# Patient Record
Sex: Male | Born: 1980 | Race: White | Hispanic: No | Marital: Married | Smoking: Never smoker
Health system: Southern US, Community
[De-identification: ages and names within clinical notes are randomized; demographics above are authoritative.]

## PROBLEM LIST (undated history)

## (undated) DIAGNOSIS — K760 Fatty (change of) liver, not elsewhere classified: Secondary | ICD-10-CM

## (undated) DIAGNOSIS — I10 Essential (primary) hypertension: Secondary | ICD-10-CM

## (undated) DIAGNOSIS — R51 Headache: Secondary | ICD-10-CM

## (undated) DIAGNOSIS — E7800 Pure hypercholesterolemia, unspecified: Secondary | ICD-10-CM

## (undated) DIAGNOSIS — IMO0002 Reserved for concepts with insufficient information to code with codable children: Secondary | ICD-10-CM

## (undated) DIAGNOSIS — G4730 Sleep apnea, unspecified: Secondary | ICD-10-CM

## (undated) DIAGNOSIS — R519 Headache, unspecified: Secondary | ICD-10-CM

## (undated) HISTORY — DX: Fatty (change of) liver, not elsewhere classified: K76.0

## (undated) HISTORY — DX: Headache, unspecified: R51.9

## (undated) HISTORY — DX: Headache: R51

## (undated) HISTORY — DX: Sleep apnea, unspecified: G47.30

---

## 2002-06-05 ENCOUNTER — Encounter (HOSPITAL_COMMUNITY): Admission: RE | Admit: 2002-06-05 | Discharge: 2002-07-05 | Payer: Self-pay | Admitting: Preventative Medicine

## 2002-06-14 HISTORY — PX: OTHER SURGICAL HISTORY: SHX169

## 2003-02-19 ENCOUNTER — Encounter: Payer: Self-pay | Admitting: Family Medicine

## 2003-02-19 ENCOUNTER — Ambulatory Visit (HOSPITAL_COMMUNITY): Admission: RE | Admit: 2003-02-19 | Discharge: 2003-02-19 | Payer: Self-pay | Admitting: Family Medicine

## 2003-02-27 ENCOUNTER — Emergency Department (HOSPITAL_COMMUNITY): Admission: EM | Admit: 2003-02-27 | Discharge: 2003-02-27 | Payer: Self-pay | Admitting: Emergency Medicine

## 2003-02-27 ENCOUNTER — Encounter: Payer: Self-pay | Admitting: Emergency Medicine

## 2003-03-21 ENCOUNTER — Encounter: Payer: Self-pay | Admitting: Otolaryngology

## 2003-03-21 ENCOUNTER — Ambulatory Visit (HOSPITAL_COMMUNITY): Admission: RE | Admit: 2003-03-21 | Discharge: 2003-03-21 | Payer: Self-pay | Admitting: Otolaryngology

## 2003-05-20 ENCOUNTER — Ambulatory Visit (HOSPITAL_COMMUNITY): Admission: RE | Admit: 2003-05-20 | Discharge: 2003-05-20 | Payer: Self-pay | Admitting: Neurology

## 2003-12-08 ENCOUNTER — Emergency Department (HOSPITAL_COMMUNITY): Admission: EM | Admit: 2003-12-08 | Discharge: 2003-12-08 | Payer: Self-pay | Admitting: Emergency Medicine

## 2007-06-27 ENCOUNTER — Ambulatory Visit (HOSPITAL_COMMUNITY): Admission: RE | Admit: 2007-06-27 | Discharge: 2007-06-27 | Payer: Self-pay | Admitting: Family Medicine

## 2007-07-17 ENCOUNTER — Emergency Department (HOSPITAL_COMMUNITY): Admission: EM | Admit: 2007-07-17 | Discharge: 2007-07-17 | Payer: Self-pay | Admitting: Emergency Medicine

## 2007-11-16 ENCOUNTER — Emergency Department (HOSPITAL_COMMUNITY): Admission: EM | Admit: 2007-11-16 | Discharge: 2007-11-16 | Payer: Self-pay | Admitting: Emergency Medicine

## 2007-11-20 ENCOUNTER — Emergency Department (HOSPITAL_COMMUNITY): Admission: EM | Admit: 2007-11-20 | Discharge: 2007-11-21 | Payer: Self-pay | Admitting: Emergency Medicine

## 2008-01-09 ENCOUNTER — Emergency Department (HOSPITAL_COMMUNITY): Admission: EM | Admit: 2008-01-09 | Discharge: 2008-01-09 | Payer: Self-pay | Admitting: Emergency Medicine

## 2009-07-05 ENCOUNTER — Emergency Department (HOSPITAL_COMMUNITY): Admission: EM | Admit: 2009-07-05 | Discharge: 2009-07-05 | Payer: Self-pay | Admitting: Emergency Medicine

## 2009-07-07 ENCOUNTER — Ambulatory Visit (HOSPITAL_COMMUNITY): Admission: RE | Admit: 2009-07-07 | Discharge: 2009-07-07 | Payer: Self-pay | Admitting: Emergency Medicine

## 2009-08-20 ENCOUNTER — Emergency Department (HOSPITAL_COMMUNITY): Admission: EM | Admit: 2009-08-20 | Discharge: 2009-08-20 | Payer: Self-pay | Admitting: Emergency Medicine

## 2009-11-21 ENCOUNTER — Ambulatory Visit: Payer: Self-pay | Admitting: Family Medicine

## 2009-11-21 DIAGNOSIS — R5383 Other fatigue: Secondary | ICD-10-CM

## 2009-11-21 DIAGNOSIS — R5381 Other malaise: Secondary | ICD-10-CM

## 2010-01-20 ENCOUNTER — Ambulatory Visit: Payer: Self-pay | Admitting: Family Medicine

## 2010-01-20 ENCOUNTER — Encounter: Payer: Self-pay | Admitting: Physician Assistant

## 2010-01-20 DIAGNOSIS — G4700 Insomnia, unspecified: Secondary | ICD-10-CM

## 2010-01-21 ENCOUNTER — Encounter: Payer: Self-pay | Admitting: Physician Assistant

## 2010-01-21 LAB — CONVERTED CEMR LAB
HCV Ab: NEGATIVE
Hepatitis B Surface Ag: NEGATIVE

## 2010-01-23 LAB — CONVERTED CEMR LAB
Albumin: 4.4 g/dL (ref 3.5–5.2)
BUN: 13 mg/dL (ref 6–23)
CO2: 28 meq/L (ref 19–32)
Calcium: 9.6 mg/dL (ref 8.4–10.5)
Chloride: 104 meq/L (ref 96–112)
Creatinine, Ser: 0.86 mg/dL (ref 0.40–1.50)
Glucose, Bld: 91 mg/dL (ref 70–99)
HCT: 47.7 % (ref 39.0–52.0)
HDL: 30 mg/dL — ABNORMAL LOW (ref >39)
Hemoglobin: 15.9 g/dL (ref 13.0–17.0)
LDL Cholesterol: 131 mg/dL — ABNORMAL HIGH (ref 0–99)
Potassium: 4.4 meq/L (ref 3.5–5.3)
RBC: 5.34 M/uL (ref 4.22–5.81)
TSH: 1.433 microintl units/mL (ref 0.350–4.500)
Total CHOL/HDL Ratio: 7.1
Triglycerides: 259 mg/dL — ABNORMAL HIGH (ref <150)
WBC: 6.7 10*3/uL (ref 4.0–10.5)

## 2010-04-09 ENCOUNTER — Emergency Department (HOSPITAL_COMMUNITY): Admission: EM | Admit: 2010-04-09 | Discharge: 2010-04-09 | Payer: Self-pay | Admitting: Emergency Medicine

## 2010-04-21 ENCOUNTER — Emergency Department (HOSPITAL_COMMUNITY): Admission: EM | Admit: 2010-04-21 | Discharge: 2010-04-21 | Payer: Self-pay | Admitting: Emergency Medicine

## 2010-07-14 NOTE — Assessment & Plan Note (Signed)
Summary: NEW PATIENT room 1   Vital Signs:  Patient profile:   30 year old male Height:      75.5 inches Weight:      337 pounds BMI:     41.72 O2 Sat:      98 % Pulse rate:   91 / minute Resp:     16 per minute BP sitting:   120 / 82  (left arm) Cuff size:   large  Vitals Entered By: Everitt Amber LPN (November 21, 2009 8:10 AM)  Nutrition Counseling: Patient's BMI is greater than 25 and therefore counseled on weight management options. CC: New Patient    CC:  New Patient .  History of Present Illness: New pt here to establish care with new PCP.  Pt states he has some painful sores in the back of his mouth on the R side x few days.  It is painful for him to swallow or eat.  He had same on L side about 1 - 1 1/2 mos ago.  No fever or chills.  He has had a cough x 2-3 days. Cough is prod with clear phlegm.  No nasal congestion or PND.  FH of DM.  Pt has not been tested for this.    Current Medications (verified): 1)  None  Allergies (verified): No Known Drug Allergies  Past History:  Past medical, surgical, family and social histories (including risk factors) reviewed, and no changes noted (except as noted below).  Past Surgical History: Cyst removed from throat-2004  Family History: Reviewed history and no changes required. Mother-deceased-diabetic, complete renal failure, on dialysis Father-living-diabetic, liver failure 1 sister-healthy 2 brothers-healthy  Social History: Reviewed history and no changes required. Occupation: Public relations account executive Single Never Smoked Alcohol use-no Drug use-no Regular exercise-yes Occupation:  employed Smoking Status:  never Drug Use:  no Does Patient Exercise:  yes  Review of Systems General:  Denies chills and fever. ENT:  Complains of earache and sore throat; denies nasal congestion and postnasal drainage; INTERMITTENT PAIN R EAR FROM THROAT. CV:  Denies chest pain or discomfort and palpitations. Resp:  Complains of  cough and sputum productive; denies shortness of breath.  Physical Exam  General:  well-nourished, well-hydrated, appropriate dress, cooperative to examination, good hygiene, and overweight-appearing.   Head:  Normocephalic and atraumatic without obvious abnormalities. No apparent alopecia or balding. Eyes:  pupils equal, pupils round, pupils reactive to light, and no injection.   Ears:  External ear exam shows no significant lesions or deformities.  Otoscopic examination reveals clear canals, tympanic membranes are intact bilaterally without bulging, retraction, inflammation or discharge. Hearing is grossly normal bilaterally. Nose:  External nasal examination shows no deformity or inflammation. Nasal mucosa are pink and moist without lesions or exudates. Mouth:  good dentition, no gingival abnormalities, and aphthous ulcer(s) noted Rt anterior pillar area.  Remainder of oroph  & mouth neg. Neck:  No deformities, masses, or tenderness noted.no thyromegaly and no thyroid nodules or tenderness.   Lungs:  Normal respiratory effort, chest expands symmetrically. Lungs are clear to auscultation, no crackles or wheezes. Heart:  Normal rate and regular rhythm. S1 and S2 normal without gallop, murmur, click, rub or other extra sounds. Skin:  Intact without suspicious lesions or rashes Cervical Nodes:  No lymphadenopathy noted Psych:  Cognition and judgment appear intact. Alert and cooperative with normal attention span and concentration. No apparent delusions, illusions, hallucinations   Impression & Recommendations:  Problem # 1:  APHTHOUS ULCERS (ICD-528.2) Assessment New  Discussed with pt that this is a viral infection.  Problem # 2:  ACUTE BRONCHITIS (ICD-466.0) Assessment: New Discussed viral infection.  Pt to call if increased s/s.  To use an over the counter cough med as needed.  Problem # 3:  OBESITY, MORBID (ICD-278.01) Assessment: Comment Only Encouraged exercise & discussed dietary  changes and healthy food choices.  Orders: T-CMP with estimated GFR ([address]) T-Lipid Profile [phone]) T- Hemoglobin A1C (82956-21308)  Complete Medication List: 1)  Acyclovir 400 Mg Tabs (Acyclovir) .... Take 1 two times a day x 7 days 2)  Lidocaine Viscous 2 % Soln (Lidocaine hcl) .... Apply to sores in mouth every 3-4 hrs as needed for pain  Other Orders: T-CBC No Diff ([id number]) T-TSH (52841-32440) T-Vitamin D (25-Hydroxy) (10272-53664)  Patient Instructions: 1)  Please schedule a follow-up appointment in 4 months. 2)  It is important that you exercise regularly at least 20 minutes 5 times a week. If you develop chest pain, have severe difficulty breathing, or feel very tired , stop exercising immediately and seek medical attention. 3)  You need to lose weight. Consider a lower calorie diet and regular exercise.  4)  I have ordered blood work for you. Prescriptions: LIDOCAINE VISCOUS 2 % SOLN (LIDOCAINE HCL) apply to sores in mouth every 3-4 hrs as needed for pain  #1 x 0   Entered and Authorized by:   Esperanza Sheets PA   Signed by:   Esperanza Sheets PA on 11/21/2009   Method used:   Electronically to        [facility]  [address] (retail)       [address]       [place], [place]  [address]       Ph: [phone]       Fax: [phone]   RxID:   [id number] ACYCLOVIR 400 MG TABS (ACYCLOVIR) take 1 two times a day x 7 days  #14 x 0   Entered and Authorized by:   Esperanza Sheets PA   Signed by:   Esperanza Sheets PA on 11/21/2009   Method used:   Electronically to        [facility]  [address] (retail)       [address]       [place], [place]  [address]       Ph: [phone]       Fax: [phone]   RxID:   [id number]

## 2010-07-14 NOTE — Assessment & Plan Note (Signed)
Summary: trouble sleeping- room 1   Vital Signs:  Patient profile:   30 year old male Height:      75.5 inches Weight:      353.25 pounds BMI:     43.73 O2 Sat:      98 % on Room air Pulse rate:   87 / minute Resp:     16 per minute BP sitting:   140 / 90  (left arm)  Vitals Entered By: Adella Hare LPN (January 20, 2010 9:17 AM)  Serial Vital Signs/Assessments:  Time      Position  BP       Pulse  Resp  Temp     By                     120/84                         Esperanza Sheets PA  CC: trouble sleeping Is Patient Diabetic? No Pain Assessment Patient in pain? no        CC:  trouble sleeping.  History of Present Illness: Pt presents today with c/o diffculty sleeping x approx 1 1/2 mos.  He began a new job about 2 mos ago and is now working nights.  He thought his sleeping pattern would switch over but he is having difficulty still.  He states he awakens frequently when trying to sleep and has a hard time getting back to sleep.  Denies feelings of depression or anxiety.  No sleep issues until started working nights. + snoring, but no observed apeas or awakening with difficulty breathing or gasping. He has tried discontinuing caffeine from his diet but no help. He tries to maintain a regular schedule even on his days off.   Allergies (verified): No Known Drug Allergies  Past History:  Past medical history reviewed for relevance to current acute and chronic problems.  Review of Systems CV:  Denies chest pain or discomfort. Resp:  Denies cough and shortness of breath. Psych:  Denies anxiety, depression, easily angered, irritability, suicidal thoughts/plans, and thoughts /plans of harming others.  Physical Exam  General:  Well-developed,well-nourished,in no acute distress; alert,appropriate and cooperative throughout examination Head:  Normocephalic and atraumatic without obvious abnormalities. No apparent alopecia or balding. Ears:  External ear exam shows no  significant lesions or deformities.  Otoscopic examination reveals clear canals, tympanic membranes are intact bilaterally without bulging, retraction, inflammation or discharge. Hearing is grossly normal bilaterally. Nose:  External nasal examination shows no deformity or inflammation. Nasal mucosa are pink and moist without lesions or exudates. Mouth:  Oral mucosa and oropharynx without lesions or exudates.  Teeth in good repair. Neck:  No deformities, masses, or tenderness noted. Lungs:  Normal respiratory effort, chest expands symmetrically. Lungs are clear to auscultation, no crackles or wheezes. Heart:  Normal rate and regular rhythm. S1 and S2 normal without gallop, murmur, click, rub or other extra sounds. Cervical Nodes:  No lymphadenopathy noted Psych:  Cognition and judgment appear intact. Alert and cooperative with normal attention span and concentration. No apparent delusions, illusions, hallucinations   Impression & Recommendations:  Problem # 1:  INSOMNIA (ICD-780.52) Assessment New Onset with new job working nights.  His updated medication list for this problem includes:    Zolpidem Tartrate 10 Mg Tabs (Zolpidem tartrate) .Marland Kitchen... Take 1 tablet at bedtime as needed for sleep  Problem # 2:  OBESITY, MORBID (ICD-278.01) Assessment: Deteriorated Discussed healthy diet,  exercise and wt loss. Discussed risk of DM and sleep apnea due to wt. Encouraged pt to have labs done previously ordered.  Another copy of the orders given to pt today.  Complete Medication List: 1)  Lidocaine Viscous 2 % Soln (Lidocaine hcl) .... Apply to sores in mouth every 3-4 hrs as needed for pain 2)  Zolpidem Tartrate 10 Mg Tabs (Zolpidem tartrate) .... Take 1 tablet at bedtime as needed for sleep  Patient Instructions: 1)  Keep your next appt. 2)  I have prescribed a sleeping medication to help you establish your new sleep schedule. 3)  It is important that you exercise regularly at least 20 minutes 5  times a week. If you develop chest pain, have severe difficulty breathing, or feel very tired , stop exercising immediately and seek medical attention. 4)  You need to lose weight. Consider a lower calorie diet and regular exercise.  Prescriptions: ZOLPIDEM TARTRATE 10 MG TABS (ZOLPIDEM TARTRATE) take 1 tablet at bedtime as needed for sleep  #30 x 0   Entered and Authorized by:   Esperanza Sheets PA   Signed by:   Esperanza Sheets PA on 01/20/2010   Method used:   Print then Give to Patient   RxID:   [id number]

## 2010-07-19 ENCOUNTER — Emergency Department (HOSPITAL_COMMUNITY)
Admission: EM | Admit: 2010-07-19 | Discharge: 2010-07-19 | Disposition: A | Discharge status: Home / Self Care | Payer: BC Managed Care – PPO | Attending: Emergency Medicine | Admitting: Emergency Medicine

## 2010-07-19 ENCOUNTER — Emergency Department (HOSPITAL_COMMUNITY): Payer: BC Managed Care – PPO

## 2010-07-19 DIAGNOSIS — R630 Anorexia: Secondary | ICD-10-CM | POA: Insufficient documentation

## 2010-07-19 DIAGNOSIS — R6883 Chills (without fever): Secondary | ICD-10-CM | POA: Insufficient documentation

## 2010-07-19 DIAGNOSIS — R51 Headache: Secondary | ICD-10-CM | POA: Insufficient documentation

## 2010-07-19 DIAGNOSIS — R197 Diarrhea, unspecified: Secondary | ICD-10-CM | POA: Insufficient documentation

## 2010-07-19 DIAGNOSIS — K219 Gastro-esophageal reflux disease without esophagitis: Secondary | ICD-10-CM | POA: Insufficient documentation

## 2010-07-19 DIAGNOSIS — B9789 Other viral agents as the cause of diseases classified elsewhere: Secondary | ICD-10-CM | POA: Insufficient documentation

## 2010-07-19 DIAGNOSIS — R05 Cough: Secondary | ICD-10-CM | POA: Insufficient documentation

## 2010-07-19 DIAGNOSIS — R5381 Other malaise: Secondary | ICD-10-CM | POA: Insufficient documentation

## 2010-07-19 DIAGNOSIS — R112 Nausea with vomiting, unspecified: Secondary | ICD-10-CM | POA: Insufficient documentation

## 2010-07-19 DIAGNOSIS — R059 Cough, unspecified: Secondary | ICD-10-CM | POA: Insufficient documentation

## 2010-07-19 DIAGNOSIS — R5383 Other fatigue: Secondary | ICD-10-CM | POA: Insufficient documentation

## 2010-07-19 DIAGNOSIS — IMO0001 Reserved for inherently not codable concepts without codable children: Secondary | ICD-10-CM | POA: Insufficient documentation

## 2010-07-19 DIAGNOSIS — R070 Pain in throat: Secondary | ICD-10-CM | POA: Insufficient documentation

## 2010-08-01 ENCOUNTER — Emergency Department (HOSPITAL_COMMUNITY)
Admission: EM | Admit: 2010-08-01 | Discharge: 2010-08-01 | Disposition: A | Discharge status: Home / Self Care | Payer: BC Managed Care – PPO | Attending: Emergency Medicine | Admitting: Emergency Medicine

## 2010-08-01 ENCOUNTER — Emergency Department (HOSPITAL_COMMUNITY): Payer: BC Managed Care – PPO

## 2010-08-01 DIAGNOSIS — J069 Acute upper respiratory infection, unspecified: Secondary | ICD-10-CM | POA: Insufficient documentation

## 2010-08-01 DIAGNOSIS — J029 Acute pharyngitis, unspecified: Secondary | ICD-10-CM | POA: Insufficient documentation

## 2010-08-01 DIAGNOSIS — R059 Cough, unspecified: Secondary | ICD-10-CM | POA: Insufficient documentation

## 2010-08-01 DIAGNOSIS — R05 Cough: Secondary | ICD-10-CM | POA: Insufficient documentation

## 2010-08-26 LAB — RAPID STREP SCREEN (MED CTR MEBANE ONLY): Streptococcus, Group A Screen (Direct): NEGATIVE

## 2010-08-26 LAB — CULTURE, RESPIRATORY W GRAM STAIN: Gram Stain: NONE SEEN

## 2010-08-30 LAB — URINALYSIS, ROUTINE W REFLEX MICROSCOPIC
Bilirubin Urine: NEGATIVE
Glucose, UA: NEGATIVE mg/dL
Hgb urine dipstick: NEGATIVE
Protein, ur: NEGATIVE mg/dL
Specific Gravity, Urine: <1.005 — ABNORMAL LOW (ref 1.005–1.030)
Urobilinogen, UA: 0.2 mg/dL (ref 0.0–1.0)

## 2010-08-30 LAB — CBC
HCT: 46.4 % (ref 39.0–52.0)
MCHC: 34.2 g/dL (ref 30.0–36.0)
MCV: 88.7 fL (ref 78.0–100.0)
RBC: 5.23 MIL/uL (ref 4.22–5.81)
WBC: 9.4 10*3/uL (ref 4.0–10.5)

## 2010-08-30 LAB — COMPREHENSIVE METABOLIC PANEL
BUN: 8 mg/dL (ref 6–23)
CO2: 27 mEq/L (ref 19–32)
Calcium: 9.1 mg/dL (ref 8.4–10.5)
Chloride: 105 mEq/L (ref 96–112)
Creatinine, Ser: 0.75 mg/dL (ref 0.4–1.5)
GFR calc non Af Amer: >60 mL/min (ref >60)
Total Bilirubin: 1 mg/dL (ref 0.3–1.2)

## 2010-08-30 LAB — LIPASE, BLOOD: Lipase: 28 U/L (ref 11–59)

## 2010-08-30 LAB — DIFFERENTIAL
Basophils Absolute: 0.1 10*3/uL (ref 0.0–0.1)
Eosinophils Relative: 1 % (ref 0–5)
Lymphocytes Relative: 33 % (ref 12–46)
Lymphs Abs: 3.1 10*3/uL (ref 0.7–4.0)
Neutro Abs: 5.4 10*3/uL (ref 1.7–7.7)
Neutrophils Relative %: 57 % (ref 43–77)

## 2011-02-08 ENCOUNTER — Ambulatory Visit (INDEPENDENT_AMBULATORY_CARE_PROVIDER_SITE_OTHER): Payer: BC Managed Care – PPO | Admitting: Family Medicine

## 2011-02-08 ENCOUNTER — Encounter: Payer: Self-pay | Admitting: Family Medicine

## 2011-02-08 DIAGNOSIS — J019 Acute sinusitis, unspecified: Secondary | ICD-10-CM | POA: Insufficient documentation

## 2011-02-08 DIAGNOSIS — R21 Rash and other nonspecific skin eruption: Secondary | ICD-10-CM

## 2011-02-08 MED ORDER — AMOXICILLIN-POT CLAVULANATE 875-125 MG PO TABS: 1.0000 | ORAL_TABLET | Freq: Two times a day (BID) | ORAL | Status: AC

## 2011-02-08 MED ORDER — METHYLPREDNISOLONE ACETATE 80 MG/ML IJ SUSP
80.0000 mg | Freq: Once | INTRAMUSCULAR | Status: AC
  Administered 2011-02-08: 80 mg via INTRAMUSCULAR

## 2011-02-08 MED ORDER — FLUTICASONE PROPIONATE 50 MCG/ACT NA SUSP: 1.0000 | Freq: Two times a day (BID) | NASAL | Status: DC

## 2011-02-08 NOTE — Progress Notes (Signed)
  Subjective:    Patient ID: Alejandro Kennedy, male    DOB: 07-01-1980, 30 y.o.   MRN: [id number]  HPI Sinus problems- cough mild production, green discharge for 3 weeks, has tried many allergy medications (Sudafed, bendryl, tylenol products)  Subjective fever, chills , non smoker, difficulty sleeping secondary to congestion and he also wears a BiPAP. A previous history of recurrent sinus problems.   OSA- sleeps with BIpap , had sleep study at [facility] --- has been on BiPAP for 4 months, unable to sleep comfortably with BiPAP secondary to nasal congestion  Rash- on LLE- started 3 weeks ago, initially had a spot on his arm now spread down, itches, used hydrocortisone, no new soap or lotion. No sick contacts, unsure if he was bitten by an insect.  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, +fever, weight loss,weakness,+ recent illness HEENT- denies eye drainage, change in vision, nasal discharge,sore throat CVS- denies chest pain, palpitations RESP- denies SOB, occ cough, denies wheeze ABD- denies N/V, change in stools, abd pain MSK- denies joint pain, muscle aches, injury Neuro- +headache, denies dizziness, syncope, seizure activity        Objective:   Physical Exam GEN- NAD, alert and oriented, obese HEENT- PERRL, EOMI, non icteric, pink conjunctiva, MMM, TM clear bilat, enlarged turbinates, thick mucous nares, mild injection of post oropharynx TTP over maxillary and ethmoid sinus on right side Nodes- shotty submandibular and cervical LAD CVS-RRR,no murmur RESP-CTAB EXT- no edema Skin- Left forearm, erythema extending to 2cm above wrist, mild warmth, multiple exocoriated lesions, ezematous rash near flexoral areas, no draining lesion, non tender to palpation Pulse- radial 2+      Assessment & Plan:

## 2011-02-08 NOTE — Patient Instructions (Addendum)
For your sinus infection, you have been given a steroid Use the flonase as well 1 spray twice a day For your skin- you can continue the cortisone, the antibiotic should help as well Return in 2 weeks for a recheck on skin, if clear you can cancel the appt

## 2011-02-09 ENCOUNTER — Telehealth: Payer: Self-pay | Admitting: Family Medicine

## 2011-02-09 ENCOUNTER — Encounter: Payer: Self-pay | Admitting: Family Medicine

## 2011-02-09 NOTE — Telephone Encounter (Signed)
You may fax a letter out of work for tonight 02/09/11. He should continue medications

## 2011-02-09 NOTE — Assessment & Plan Note (Signed)
Unsure of patient's cause of rash. He does have warmth to the area which makes me a little concern for cellulitis. However his symptoms are mostly itching which is more consistent with a contact dermatitis. He was given steroid injection today he will continue to use the hydrocortisone cream which helps some. Given Augmentin to cover sinusitis as well. He will followup in 2 weeks if there is no improvement

## 2011-02-09 NOTE — Assessment & Plan Note (Signed)
We'll treat for acute sinusitis based only of symptoms. We'll start Augmentin twice a day as well as Flonase. Patient given steroid injection in clinic

## 2011-02-10 NOTE — Telephone Encounter (Signed)
Called patient, no answer 

## 2011-02-10 NOTE — Telephone Encounter (Signed)
Letter printed. Called patient no answer

## 2011-02-11 NOTE — Telephone Encounter (Signed)
Letter ready for pick up.  Patient aware  

## 2011-03-01 ENCOUNTER — Encounter: Payer: Self-pay | Admitting: Family Medicine

## 2011-03-01 ENCOUNTER — Ambulatory Visit (INDEPENDENT_AMBULATORY_CARE_PROVIDER_SITE_OTHER): Payer: BC Managed Care – PPO | Admitting: Family Medicine

## 2011-03-01 DIAGNOSIS — R7989 Other specified abnormal findings of blood chemistry: Secondary | ICD-10-CM

## 2011-03-01 DIAGNOSIS — E785 Hyperlipidemia, unspecified: Secondary | ICD-10-CM | POA: Insufficient documentation

## 2011-03-01 DIAGNOSIS — R21 Rash and other nonspecific skin eruption: Secondary | ICD-10-CM

## 2011-03-01 MED ORDER — BETAMETHASONE DIPROPIONATE 0.05 % EX CREA: TOPICAL_CREAM | Freq: Two times a day (BID) | CUTANEOUS | Status: DC

## 2011-03-01 MED ORDER — OMEPRAZOLE 40 MG PO CPDR: 40.0000 mg | DELAYED_RELEASE_CAPSULE | Freq: Every day | ORAL | Status: DC

## 2011-03-01 NOTE — Assessment & Plan Note (Signed)
Check FLP 

## 2011-03-01 NOTE — Assessment & Plan Note (Signed)
This still looks like an atopic dermatitis of some sort. Will give high potency steroid cream as it is improved, hydrate skin, if no improvement in next 7-10 days will refer to derm at pt request, he will call and let me know

## 2011-03-01 NOTE — Progress Notes (Signed)
  Subjective:    Patient ID: Alejandro Kennedy, male    DOB: July 17, 1980, 30 y.o.   MRN: [id number]  HPI Patient here to followup on left arm rash. He is status post Augmentin which is also used for his sinusitis, steroid injection and topical hydrocortisone approximately 2 weeks ago. The rash has improved on his left forearm especially after steroids  however he continues to have intense itching at times. Denies fever or pustules  Labs- labs reviewed from one year ago. 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

## 2011-03-01 NOTE — Patient Instructions (Addendum)
Get your labs done- fasting   I will check your cholesterol and liver enzymes Use the steroid cream and keep lotion on the arm- if your arm does not improve then I will send you to dermatology We will call with lab results  You should be seen at least once a year

## 2011-03-01 NOTE — Assessment & Plan Note (Signed)
Unclear cause, pt states he was sick at that time. Will recheck LFT, discussed with pt and wife, the labs were from over 1 year ago, I have no specific clues besides elevated lipids why his LFT would be up. If still elevated would check viral panel as he works in Editor, commissioning, ultrasound. He has not been taking any hepatoxic OTC meds such as Tylenol on a regular basis

## 2011-03-11 LAB — RAPID STREP SCREEN (MED CTR MEBANE ONLY): Streptococcus, Group A Screen (Direct): NEGATIVE

## 2011-03-11 LAB — MONONUCLEOSIS SCREEN: Mono Screen: NEGATIVE

## 2011-05-03 ENCOUNTER — Emergency Department (HOSPITAL_COMMUNITY)
Admission: EM | Admit: 2011-05-03 | Discharge: 2011-05-03 | Disposition: A | Discharge status: Home / Self Care | Payer: BC Managed Care – PPO | Attending: Emergency Medicine | Admitting: Emergency Medicine

## 2011-05-03 ENCOUNTER — Encounter (HOSPITAL_COMMUNITY): Payer: Self-pay | Admitting: *Deleted

## 2011-05-03 ENCOUNTER — Emergency Department (HOSPITAL_COMMUNITY): Payer: BC Managed Care – PPO

## 2011-05-03 ENCOUNTER — Telehealth: Payer: Self-pay

## 2011-05-03 DIAGNOSIS — R748 Abnormal levels of other serum enzymes: Secondary | ICD-10-CM

## 2011-05-03 DIAGNOSIS — R109 Unspecified abdominal pain: Secondary | ICD-10-CM

## 2011-05-03 DIAGNOSIS — R10819 Abdominal tenderness, unspecified site: Secondary | ICD-10-CM | POA: Insufficient documentation

## 2011-05-03 DIAGNOSIS — R609 Edema, unspecified: Secondary | ICD-10-CM | POA: Insufficient documentation

## 2011-05-03 DIAGNOSIS — R7402 Elevation of levels of lactic acid dehydrogenase (LDH): Secondary | ICD-10-CM | POA: Insufficient documentation

## 2011-05-03 DIAGNOSIS — R7401 Elevation of levels of liver transaminase levels: Secondary | ICD-10-CM | POA: Insufficient documentation

## 2011-05-03 HISTORY — DX: Reserved for concepts with insufficient information to code with codable children: IMO0002

## 2011-05-03 LAB — COMPREHENSIVE METABOLIC PANEL
ALT: 65 U/L — ABNORMAL HIGH (ref 0–53)
AST: 36 U/L (ref 0–37)
CO2: 28 mEq/L (ref 19–32)
Calcium: 10.2 mg/dL (ref 8.4–10.5)
GFR calc non Af Amer: >90 mL/min (ref >90)
Potassium: 4 mEq/L (ref 3.5–5.1)
Sodium: 137 mEq/L (ref 135–145)
Total Protein: 7.4 g/dL (ref 6.0–8.3)

## 2011-05-03 LAB — URINALYSIS, ROUTINE W REFLEX MICROSCOPIC
Glucose, UA: NEGATIVE mg/dL
Hgb urine dipstick: NEGATIVE
Specific Gravity, Urine: >1.03 — ABNORMAL HIGH (ref 1.005–1.030)

## 2011-05-03 LAB — DIFFERENTIAL
Basophils Absolute: 0 10*3/uL (ref 0.0–0.1)
Eosinophils Relative: 2 % (ref 0–5)
Lymphocytes Relative: 33 % (ref 12–46)
Neutrophils Relative %: 56 % (ref 43–77)

## 2011-05-03 LAB — CBC
MCV: 87.7 fL (ref 78.0–100.0)
Platelets: 230 10*3/uL (ref 150–400)
RDW: 13 % (ref 11.5–15.5)
WBC: 7.6 10*3/uL (ref 4.0–10.5)

## 2011-05-03 MED ORDER — IOHEXOL 300 MG/ML  SOLN
100.0000 mL | Freq: Once | INTRAMUSCULAR | Status: AC | PRN
  Administered 2011-05-03: 100 mL via INTRAVENOUS

## 2011-05-03 MED ORDER — METOCLOPRAMIDE HCL 10 MG PO TABS: 10.0000 mg | ORAL_TABLET | Freq: Four times a day (QID) | ORAL | Status: AC

## 2011-05-03 MED ORDER — HYDROMORPHONE HCL PF 1 MG/ML IJ SOLN
1.0000 mg | Freq: Once | INTRAMUSCULAR | Status: AC
  Administered 2011-05-03: 1 mg via INTRAVENOUS
  Filled 2011-05-03: qty 1

## 2011-05-03 MED ORDER — SODIUM CHLORIDE 0.9 % IV SOLN
999.0000 mL | INTRAVENOUS | Status: DC
  Administered 2011-05-03: 999 mL via INTRAVENOUS

## 2011-05-03 MED ORDER — OXYCODONE-ACETAMINOPHEN 5-325 MG PO TABS: 1.0000 | ORAL_TABLET | ORAL | Status: AC | PRN

## 2011-05-03 MED ORDER — ONDANSETRON HCL 4 MG/2ML IJ SOLN
4.0000 mg | Freq: Once | INTRAMUSCULAR | Status: AC
  Administered 2011-05-03: 4 mg via INTRAVENOUS
  Filled 2011-05-03: qty 2

## 2011-05-03 NOTE — ED Notes (Signed)
Pt states he has a "knot on the left side" of his abdomen that comes and goes.  Pt states BM today at 3:30 prior to abdominal pain starting.  Pt states he has had "less appetite over the past 3 months."

## 2011-05-03 NOTE — ED Provider Notes (Addendum)
History     CSN: [id number] Arrival date & time: 05/03/2011  5:34 PM   First MD Initiated Contact with Patient 05/03/11 1759      Chief Complaint  Patient presents with  . Abdominal Pain    (Consider location/radiation/quality/duration/timing/severity/associated sxs/prior treatment) The history is provided by the patient.   Pain is primarily in the left upper quadrant with radiation across the upper abdomen into the lower abdomen. It is described as a sharp pain. It does not radiate to the back or chest or shoulder. Pain is associated with a sense of abdominal distention. He denies nausea, vomiting, diarrhea. He denies fever, chills, sweats. He said 2 other similar episodes over the last month each which only lasted a few hours. He ate some tuna about 3 hours before onset of pain. He does not recall what it even prior to the other episodes of pain. His wife states he has a history of peptic ulcer disease and contradicts his history stating that one of his episodes of pain lasted 2 days. He rates his pain at 5/10. Nothing makes it better nothing makes it worse. Past Medical History  Diagnosis Date  . Ulcer     Past Surgical History  Procedure Date  . Cyst removed 2004    from throat    Family History  Problem Relation Age of Onset  . Diabetes Mother   . Hypertension Mother   . Diabetes Father     History  Substance Use Topics  . Smoking status: Never Smoker   . Smokeless tobacco: Not on file  . Alcohol Use: Yes     rarely      Review of Systems  All other systems reviewed and are negative.    Allergies  Review of patient's allergies indicates no known allergies.  Home Medications   Current Outpatient Rx  Name Route Sig Dispense Refill  . BETAMETHASONE DIPROPIONATE 0.05 % EX CREA Topical Apply topically 2 (two) times daily. 45 g 1  . FLUTICASONE PROPIONATE 50 MCG/ACT NA SUSP Nasal Place 1 spray into the nose 2 (two) times daily. 16 g 2  . OMEPRAZOLE 40 MG PO  CPDR Oral Take 1 capsule (40 mg total) by mouth daily. 30 capsule 6    BP 149/87  Pulse 87  Temp(Src) 98.1 F (36.7 C) (Oral)  Resp 18  Ht 6\' 5"  (1.956 m)  Wt 350 lb (158.759 kg)  BMI 41.50 kg/m2  SpO2 96%  Physical Exam  Nursing note and vitals reviewed.  30 year old male who is obese and in no acute distress. Vital signs are normal. Head is normocephalic and atraumatic. PERRLA, EOMI. Sclerae do not show any icterus. Oropharynx is clear. Neck is supple without adenopathy or JVD. Lungs are clear without rales, wheezes, rhonchi. Back is nontender without any CVA tenderness. There is no chest wall tenderness. Heart regular rate and rhythm without murmur. Abdomen is obese, soft. There is moderate tenderness across the upper abdomen. There is a mildly positive Murphy's sign, but there is also tenderness on palpation with deep inspiration in the left upper quadrant. There is no tenderness to palpation on deep inspiration in the epigastrium. There is no rebound or guarding. Peristalsis is diminished. Extremities have 1+ edema, no cyanosis. Neurologic: Mental status is normal, cranial nerves are intact, there are no motor or sensory deficits. Psychiatric: No abnormalities of mood or affect. ED Course  Procedures (including critical care time)   Labs Reviewed  CBC  DIFFERENTIAL  COMPREHENSIVE METABOLIC PANEL  LIPASE, BLOOD  URINALYSIS, ROUTINE W REFLEX MICROSCOPIC   No results found.   No diagnosis found.  Results for orders placed during the hospital encounter of 05/03/11  CBC      Component Value Range   WBC 7.6  4.0 - 10.5 (K/uL)   RBC 4.88  4.22 - 5.81 (MIL/uL)   Hemoglobin 14.2  13.0 - 17.0 (g/dL)   HCT 16.1  09.6 - 04.5 (%)   MCV 87.7  78.0 - 100.0 (fL)   MCH 29.1  26.0 - 34.0 (pg)   MCHC 33.2  30.0 - 36.0 (g/dL)   RDW 40.9  81.1 - 91.4 (%)   Platelets 230  150 - 400 (K/uL)  DIFFERENTIAL      Component Value Range   Neutrophils Relative 56  43 - 77 (%)   Neutro Abs 4.3   1.7 - 7.7 (K/uL)   Lymphocytes Relative 33  12 - 46 (%)   Lymphs Abs 2.5  0.7 - 4.0 (K/uL)   Monocytes Relative 9  3 - 12 (%)   Monocytes Absolute 0.7  0.1 - 1.0 (K/uL)   Eosinophils Relative 2  0 - 5 (%)   Eosinophils Absolute 0.1  0.0 - 0.7 (K/uL)   Basophils Relative 0  0 - 1 (%)   Basophils Absolute 0.0  0.0 - 0.1 (K/uL)  COMPREHENSIVE METABOLIC PANEL      Component Value Range   Sodium 137  135 - 145 (mEq/L)   Potassium 4.0  3.5 - 5.1 (mEq/L)   Chloride 101  96 - 112 (mEq/L)   CO2 28  19 - 32 (mEq/L)   Glucose, Bld 95  70 - 99 (mg/dL)   BUN 12  6 - 23 (mg/dL)   Creatinine, Ser 7.82  0.50 - 1.35 (mg/dL)   Calcium 95.6  8.4 - 10.5 (mg/dL)   Total Protein 7.4  6.0 - 8.3 (g/dL)   Albumin 3.8  3.5 - 5.2 (g/dL)   AST 36  0 - 37 (U/L)   ALT 65 (*) 0 - 53 (U/L)   Alkaline Phosphatase 70  39 - 117 (U/L)   Total Bilirubin 0.4  0.3 - 1.2 (mg/dL)   GFR calc non Af Amer >90  >90 (mL/min)   GFR calc Af Amer >90  >90 (mL/min)  LIPASE, BLOOD      Component Value Range   Lipase 38  11 - 59 (U/L)  URINALYSIS, ROUTINE W REFLEX MICROSCOPIC      Component Value Range   Color, Urine YELLOW  YELLOW    Appearance CLEAR  CLEAR    Specific Gravity, Urine >1.030 (*) 1.005 - 1.030    pH 6.0  5.0 - 8.0    Glucose, UA NEGATIVE  NEGATIVE (mg/dL)   Hgb urine dipstick NEGATIVE  NEGATIVE    Bilirubin Urine NEGATIVE  NEGATIVE    Ketones, ur NEGATIVE  NEGATIVE (mg/dL)   Protein, ur NEGATIVE  NEGATIVE (mg/dL)   Urobilinogen, UA 0.2  0.0 - 1.0 (mg/dL)   Nitrite NEGATIVE  NEGATIVE    Leukocytes, UA NEGATIVE  NEGATIVE    Ct Abdomen Pelvis W Contrast  05/03/2011  *RADIOLOGY REPORT*  Clinical Data: Abdominal pain, swelling  CT ABDOMEN AND PELVIS WITH CONTRAST  Technique:  Multidetector CT imaging of the abdomen and pelvis was performed following the standard protocol during bolus administration of intravenous contrast.  Contrast: OMNIPAQUE IOHEXOL 300 MG/ML IV SOLN  Comparison: 07/07/2009 abdominal  ultrasound  Findings: Lung bases clear.  Normal  heart size.  No pericardial or pleural effusion.  No hiatal hernia.  Abdomen:  Diffuse hypoattenuation of the liver parenchyma consistent with hepatic steatosis (fatty infiltration).  No focal hepatic abnormality or biliary dilatation.  Patent portal vein.  Gallbladder, biliary system, pancreas, spleen, adrenal glands, and kidneys demonstrate no acute process and are within normal limits for age.  Retroaortic left renal vein noted, a normal variant.  Mildly prominent porta hepatis and peripancreatic lymph nodes without definite adenopathy.  No bowel obstruction pattern, dilatation, ileus, or free air.  No abdominal free fluid, fluid collection, hemorrhage, abscess.  In the right upper quadrant beneath the right liver margin, there is a single loop of small bowel which appears to be edematous and have wall thickening.  This is nonspecific in appearance.  No associated pneumatosis.  Localized enteritis not excluded. Superior mesenteric, celiac, and IMA all enhance and appear patent. The superior mesenteric vein is also patent throughout the mesentery.  Pelvis:  Normal appendix identified.  No pelvic free fluid, fluid collection, hemorrhage, adenopathy, or neural abnormality hernia. Normal urinary bladder.  Distal colon is decompressed.  Degenerative changes of the lower lumbar facets and L5 S1 disc space.  IMPRESSION: Hepatic steatosis  Right upper quadrant short segment small bowel wall thickening / edema suspicious for a nonspecific localized enteritis.  No associate obstruction pattern, free air pneumatosis.  Normal appendix  No acute intrapelvic process.  Original Report Authenticated By: Judie Petit. Ruel Favors, M.D.   Pain is significantly improved after IV Dilaudid-HP and Zofran. No gallstones are seen on CT scan, but he should get an ultrasound to rule out cholelithiasis. Ultrasound has been ordered as an outpatient.  MDM  Abdominal pain: Possible biliary tract  disease, possible peptic ulcer disease.        Dione Booze, MD 05/03/11 2020  Dione Booze, MD 06/23/11 [phone]

## 2011-05-03 NOTE — ED Notes (Signed)
Radiology called and stated the patient's ultrasound is scheduled for 1215 tomorrow 05/04/11.

## 2011-05-03 NOTE — ED Notes (Signed)
Pt c/o abdominal pain and swelling x 1 month off and on. Denies nausea or vomiting. Pt states that he had diarrhea x 1 yesterday.

## 2011-05-03 NOTE — Telephone Encounter (Signed)
Wife states pt has a very swollen abdomen and it is very sore to the touch and tight. Advised ER

## 2011-05-04 ENCOUNTER — Ambulatory Visit (HOSPITAL_COMMUNITY)
Admit: 2011-05-04 | Discharge: 2011-05-04 | Disposition: A | Discharge status: Home / Self Care | Payer: BC Managed Care – PPO | Source: Ambulatory Visit | Attending: Emergency Medicine | Admitting: Emergency Medicine

## 2011-05-04 DIAGNOSIS — K7689 Other specified diseases of liver: Secondary | ICD-10-CM | POA: Insufficient documentation

## 2011-05-04 DIAGNOSIS — R1900 Intra-abdominal and pelvic swelling, mass and lump, unspecified site: Secondary | ICD-10-CM | POA: Insufficient documentation

## 2011-05-04 DIAGNOSIS — R109 Unspecified abdominal pain: Secondary | ICD-10-CM | POA: Insufficient documentation

## 2011-05-04 NOTE — Progress Notes (Signed)
Ultrasound report has been received and no gallstones are seen. This information is relayed to the patient.

## 2011-05-20 ENCOUNTER — Ambulatory Visit (INDEPENDENT_AMBULATORY_CARE_PROVIDER_SITE_OTHER): Payer: BC Managed Care – PPO | Admitting: Gastroenterology

## 2011-05-20 ENCOUNTER — Encounter: Payer: Self-pay | Admitting: Gastroenterology

## 2011-05-20 DIAGNOSIS — R1012 Left upper quadrant pain: Secondary | ICD-10-CM | POA: Insufficient documentation

## 2011-05-20 DIAGNOSIS — R1013 Epigastric pain: Secondary | ICD-10-CM

## 2011-05-20 DIAGNOSIS — R7989 Other specified abnormal findings of blood chemistry: Secondary | ICD-10-CM

## 2011-05-20 MED ORDER — PANTOPRAZOLE SODIUM 40 MG PO TBEC: 40.0000 mg | DELAYED_RELEASE_TABLET | Freq: Every day | ORAL | Status: DC

## 2011-05-20 NOTE — Assessment & Plan Note (Signed)
Hx mildly elevated LFTs, last drawn actually improved. Hepatitis panel from Aug 2011 negative. [place] of abdomen with diffuse fatty infiltration of liver. Will need repeat LFTs in 6 weeks, wt loss efforts, low-fat diet.

## 2011-05-20 NOTE — Patient Instructions (Signed)
Stop Prilosec. Start Protonix 40 mg daily. Take 30 minutes before the first meal of the day.  We have set you up for an upper endoscopy to look at your esophagus and stomach with Dr. Darrick Penna in the near future.  We will also recheck your liver panel in 6 weeks. If these are still elevated, we may need to proceed with further labs to rule out any other etiology besides fatty liver.

## 2011-05-20 NOTE — Progress Notes (Signed)
Primary Care Physician:  Syliva Overman, MD, MD Primary Gastroenterologist:  Dr. Darrick Penna   Chief Complaint  Patient presents with  . Abdominal Pain  . Bloated    HPI:   Alejandro Kennedy is a pleasant 30 year old male who presents today secondary to intermittent epigastric and LUQ pain. He was recently seen in the ED and referred here. Ultrasound showed diffuse fatty liver. Has history of mildly elevated transaminases. Appears Hepatitis panel in Aug 2011 was negative. Recent LFTs in October overall improved. States he was told he had ulcers several years ago at [facility]. Reports some type of radiology procedure, which sounds like an UGI. However, I am unable to find this anywhere. He has been on Prilosec for the past 2 years intermittently. However, in the past 4-5 months he has been taking this regularly. No significant improvement in epigastric pain. Reports intermittent reflux, no dysphagia. Pain sometimes associated with eating. No N/V. No melena or hematochezia. No wt loss, actually reports wt gain. Appetite waxes and wanes. Takes ibuprofen about 2 times a week for headaches. No aspiring powders.   Had one bout of diarrhea a few days before hospital, but this has resolved.   Nov 2012: CBC without anemia, mild elevation of ALT at 65, prior hx of AST/ALT mild elevation. Lipase normal.   [place] abd Nov 2012: diffuse fatty infiltration of liver  CT Nov 2012IMPRESSION:  Hepatic steatosis  Right upper quadrant short segment small bowel wall thickening /  edema suspicious for a nonspecific localized enteritis. No  associate obstruction pattern, free air pneumatosis.  Normal appendix  No acute intrapelvic process.  Past Medical History  Diagnosis Date  . Ulcer     per pt was told he had an ulcer, no prior EGD  . Generalized headaches   . Sleep apnea     Past Surgical History  Procedure Date  . Cyst removed 2004    from throat    Current Outpatient Prescriptions  Medication Sig Dispense  Refill  . ibuprofen (ADVIL,MOTRIN) 200 MG tablet Take 400-800 mg by mouth as needed. For occasional pain       . omeprazole (PRILOSEC) 40 MG capsule Take 1 capsule (40 mg total) by mouth daily.  30 capsule  6  . Soft Lens Products (SALINE SENSITIVE EYES) SOLN Apply 1 drop to eye as needed. For contacts       . pantoprazole (PROTONIX) 40 MG tablet Take 1 tablet (40 mg total) by mouth daily. 30 minutes before first meal of day  30 tablet  1    Allergies as of 05/20/2011 - Review Complete 05/20/2011  Allergen Reaction Noted  . Bee venom  05/03/2011    Family History  Problem Relation Age of Onset  . Diabetes Mother   . Hypertension Mother   . Diabetes Father   . Colon cancer Neg Hx     History   Social History  . Marital Status: Single    Spouse Name: N/A    Number of Children: N/A  . Years of Education: N/A   Occupational History  . [employer]    Social History Main Topics  . Smoking status: Never Smoker   . Smokeless tobacco: Not on file  . Alcohol Use: Yes     rarely  . Drug Use: No  . Sexually Active: Not on file   Other Topics Concern  . Not on file   Social History Narrative  . No narrative on file    Review of Systems:  Gen: Denies any fever, chills, fatigue, weight loss, lack of appetite.  CV: Denies chest pain, heart palpitations, peripheral edema, syncope.  Resp: Denies shortness of breath at rest or with exertion. Denies wheezing or cough.  GI: Denies dysphagia or odynophagia. Denies jaundice, hematemesis, fecal incontinence. GU : Denies urinary burning, urinary frequency, urinary hesitancy MS: Denies joint pain, muscle weakness, cramps, or limitation of movement.  Derm: Denies rash, itching, dry skin Psych: Denies depression, anxiety, memory loss, and confusion Heme: Denies bruising, bleeding, and enlarged lymph nodes.  Physical Exam: BP 140/86  Pulse 80  Temp(Src) 98.2 F (36.8 C) (Temporal)  Ht 6\' 5"  (1.956 m)  Wt 364 lb 6.4 oz  (165.291 kg)  BMI 43.21 kg/m2 General:   Alert and oriented. Pleasant and cooperative. Well-nourished and well-developed.  Head:  Normocephalic and atraumatic. Eyes:  Without icterus, sclera clear and conjunctiva pink.  Ears:  Normal auditory acuity. Nose:  No deformity, discharge,  or lesions. Mouth:  No deformity or lesions, oral mucosa pink.  Neck:  Supple, without mass or thyromegaly. Lungs:  Clear to auscultation bilaterally. No wheezes, rales, or rhonchi. No distress.  Heart:  S1, S2 present without murmurs appreciated.  Abdomen:  +BS, soft, obese, non-tender and non-distended. No HSM noted. No guarding or rebound. No masses appreciated.  Rectal:  Deferred  Msk:  Symmetrical without gross deformities. Normal posture. Pulses:  Normal pulses noted. Extremities:  Without clubbing or edema. Neurologic:  Alert and  oriented x4;  grossly normal neurologically. Skin:  Intact without significant lesions or rashes. Cervical Nodes:  No significant cervical adenopathy. Psych:  Alert and cooperative. Normal mood and affect.

## 2011-05-20 NOTE — Assessment & Plan Note (Signed)
30 year old male with epigastric, LUQ pain, sometimes worsened by eating. No improvement with Prilosec X last 4-5 months. Has actually been on Prilosec for several years. Told several years ago that he had an "ulcer". Unable to find documentation regarding this from [facility]. CBC without anemia. Intermittent reflux, no N/V, melena, or hematochezia. Appetite waxes and wanes, + wt gain in past several years. Intermittent ibuprofen use several times a week for headaches. As of note, hx of mildly elevated LFTs, addressed below.   Due to continued epigastric pain despite PPI, will proceed with EGD, switch PPIs in interim. Discussed wt loss with pt. Due to insurance constraints, will need to proceed with Protonix next before any other PPI such as Dexilant, Nexium, or Aciphex. Pt's BMI is 43.   Proceed with upper endoscopy in the near future with Dr. Darrick Penna. The risks, benefits, and alternatives have been discussed in detail with patient. They have stated understanding and desire to proceed.  Protonix rx sent to pharmacy.

## 2011-05-20 NOTE — Assessment & Plan Note (Signed)
See "epigastric pain"

## 2011-05-20 NOTE — Progress Notes (Signed)
Cc to PCP 

## 2011-05-25 NOTE — Progress Notes (Signed)
PT SHOULD BE TOLD TO AVOID IBUPROFEN.  REVIEWED.

## 2011-06-02 ENCOUNTER — Encounter (HOSPITAL_COMMUNITY): Payer: Self-pay | Admitting: Pharmacy Technician

## 2011-06-14 MED ORDER — SODIUM CHLORIDE 0.45 % IV SOLN
Freq: Once | INTRAVENOUS | Status: AC
  Administered 2011-06-16: 10:00:00 via INTRAVENOUS

## 2011-06-16 ENCOUNTER — Other Ambulatory Visit: Payer: Self-pay | Admitting: Gastroenterology

## 2011-06-16 ENCOUNTER — Encounter (HOSPITAL_COMMUNITY): Payer: Self-pay

## 2011-06-16 ENCOUNTER — Encounter (HOSPITAL_COMMUNITY)
Admission: RE | Disposition: A | Discharge status: Home / Self Care | Payer: Self-pay | Source: Ambulatory Visit | Attending: Gastroenterology

## 2011-06-16 ENCOUNTER — Encounter: Payer: Self-pay | Admitting: Family Medicine

## 2011-06-16 ENCOUNTER — Ambulatory Visit (HOSPITAL_COMMUNITY)
Admission: RE | Admit: 2011-06-16 | Discharge: 2011-06-16 | Disposition: A | Discharge status: Home / Self Care | Payer: BC Managed Care – PPO | Source: Ambulatory Visit | Attending: Gastroenterology | Admitting: Gastroenterology

## 2011-06-16 DIAGNOSIS — R1013 Epigastric pain: Secondary | ICD-10-CM

## 2011-06-16 DIAGNOSIS — R1012 Left upper quadrant pain: Secondary | ICD-10-CM

## 2011-06-16 DIAGNOSIS — K2990 Gastroduodenitis, unspecified, without bleeding: Secondary | ICD-10-CM

## 2011-06-16 DIAGNOSIS — R109 Unspecified abdominal pain: Secondary | ICD-10-CM

## 2011-06-16 DIAGNOSIS — K2970 Gastritis, unspecified, without bleeding: Secondary | ICD-10-CM

## 2011-06-16 DIAGNOSIS — R933 Abnormal findings on diagnostic imaging of other parts of digestive tract: Secondary | ICD-10-CM | POA: Insufficient documentation

## 2011-06-16 HISTORY — PX: ESOPHAGOGASTRODUODENOSCOPY: SHX5428

## 2011-06-16 SURGERY — EGD (ESOPHAGOGASTRODUODENOSCOPY)
Anesthesia: Moderate Sedation

## 2011-06-16 MED ORDER — STERILE WATER FOR IRRIGATION IR SOLN
Status: DC | PRN
  Administered 2011-06-16: 12:00:00

## 2011-06-16 MED ORDER — MEPERIDINE HCL 100 MG/ML IJ SOLN
INTRAMUSCULAR | Status: DC | PRN
  Administered 2011-06-16 (×2): 50 mg via INTRAVENOUS

## 2011-06-16 MED ORDER — MEPERIDINE HCL 100 MG/ML IJ SOLN
INTRAMUSCULAR | Status: AC
  Filled 2011-06-16: qty 2

## 2011-06-16 MED ORDER — MIDAZOLAM HCL 5 MG/5ML IJ SOLN
INTRAMUSCULAR | Status: DC | PRN
  Administered 2011-06-16 (×2): 2 mg via INTRAVENOUS

## 2011-06-16 MED ORDER — MIDAZOLAM HCL 5 MG/5ML IJ SOLN
INTRAMUSCULAR | Status: AC
  Filled 2011-06-16: qty 10

## 2011-06-16 MED ORDER — BUTAMBEN-TETRACAINE-BENZOCAINE 2-2-14 % EX AERO
INHALATION_SPRAY | CUTANEOUS | Status: DC | PRN
  Administered 2011-06-16: 1 via TOPICAL

## 2011-06-16 NOTE — H&P (View-Only) (Signed)
Primary Care Physician:  Margaret Simpson, MD, MD Primary Gastroenterologist:  Dr. Fields   Chief Complaint  Patient presents with  . Abdominal Pain  . Bloated    HPI:   [name] is a pleasant 31-year-old male who presents today secondary to intermittent epigastric and LUQ pain. He was recently seen in the ED and referred here. Ultrasound showed diffuse fatty liver. Has history of mildly elevated transaminases. Appears Hepatitis panel in Aug 2011 was negative. Recent LFTs in October overall improved. States he was told he had ulcers several years ago at [facility]. Reports some type of radiology procedure, which sounds like an UGI. However, I am unable to find this anywhere. He has been on Prilosec for the past 2 years intermittently. However, in the past 4-5 months he has been taking this regularly. No significant improvement in epigastric pain. Reports intermittent reflux, no dysphagia. Pain sometimes associated with eating. No N/V. No melena or hematochezia. No wt loss, actually reports wt gain. Appetite waxes and wanes. Takes ibuprofen about 2 times a week for headaches. No aspiring powders.   Had one bout of diarrhea a few days before hospital, but this has resolved.   Nov 2012: CBC without anemia, mild elevation of ALT at 65, prior hx of AST/ALT mild elevation. Lipase normal.   US abd Nov 2012: diffuse fatty infiltration of liver  CT Nov 2012IMPRESSION:  Hepatic steatosis  Right upper quadrant short segment small bowel wall thickening /  edema suspicious for a nonspecific localized enteritis. No  associate obstruction pattern, free air pneumatosis.  Normal appendix  No acute intrapelvic process.  Past Medical History  Diagnosis Date  . Ulcer     per pt was told he had an ulcer, no prior EGD  . Generalized headaches   . Sleep apnea     Past Surgical History  Procedure Date  . Cyst removed 2004    from throat    Current Outpatient Prescriptions  Medication Sig Dispense  Refill  . ibuprofen (ADVIL,MOTRIN) 200 MG tablet Take 400-800 mg by mouth as needed. For occasional pain       . omeprazole (PRILOSEC) 40 MG capsule Take 1 capsule (40 mg total) by mouth daily.  30 capsule  6  . Soft Lens Products (SALINE SENSITIVE EYES) SOLN Apply 1 drop to eye as needed. For contacts       . pantoprazole (PROTONIX) 40 MG tablet Take 1 tablet (40 mg total) by mouth daily. 30 minutes before first meal of day  30 tablet  1    Allergies as of 05/20/2011 - Review Complete 05/20/2011  Allergen Reaction Noted  . Bee venom  05/03/2011    Family History  Problem Relation Age of Onset  . Diabetes Mother   . Hypertension Mother   . Diabetes Father   . Colon cancer Neg Hx     History   Social History  . Marital Status: Single    Spouse Name: N/A    Number of Children: N/A  . Years of Education: N/A   Occupational History  . [employer]    Social History Main Topics  . Smoking status: Never Smoker   . Smokeless tobacco: Not on file  . Alcohol Use: Yes     rarely  . Drug Use: No  . Sexually Active: Not on file   Other Topics Concern  . Not on file   Social History Narrative  . No narrative on file    Review of Systems:   Gen: Denies any fever, chills, fatigue, weight loss, lack of appetite.  CV: Denies chest pain, heart palpitations, peripheral edema, syncope.  Resp: Denies shortness of breath at rest or with exertion. Denies wheezing or cough.  GI: Denies dysphagia or odynophagia. Denies jaundice, hematemesis, fecal incontinence. GU : Denies urinary burning, urinary frequency, urinary hesitancy MS: Denies joint pain, muscle weakness, cramps, or limitation of movement.  Derm: Denies rash, itching, dry skin Psych: Denies depression, anxiety, memory loss, and confusion Heme: Denies bruising, bleeding, and enlarged lymph nodes.  Physical Exam: BP 140/86  Pulse 80  Temp(Src) 98.2 F (36.8 C) (Temporal)  Ht 6' 5" (1.956 m)  Wt 364 lb 6.4 oz  (165.291 kg)  BMI 43.21 kg/m2 General:   Alert and oriented. Pleasant and cooperative. Well-nourished and well-developed.  Head:  Normocephalic and atraumatic. Eyes:  Without icterus, sclera clear and conjunctiva pink.  Ears:  Normal auditory acuity. Nose:  No deformity, discharge,  or lesions. Mouth:  No deformity or lesions, oral mucosa pink.  Neck:  Supple, without mass or thyromegaly. Lungs:  Clear to auscultation bilaterally. No wheezes, rales, or rhonchi. No distress.  Heart:  S1, S2 present without murmurs appreciated.  Abdomen:  +BS, soft, obese, non-tender and non-distended. No HSM noted. No guarding or rebound. No masses appreciated.  Rectal:  Deferred  Msk:  Symmetrical without gross deformities. Normal posture. Pulses:  Normal pulses noted. Extremities:  Without clubbing or edema. Neurologic:  Alert and  oriented x4;  grossly normal neurologically. Skin:  Intact without significant lesions or rashes. Cervical Nodes:  No significant cervical adenopathy. Psych:  Alert and cooperative. Normal mood and affect.    

## 2011-06-16 NOTE — Interval H&P Note (Signed)
History and Physical Interval Note:  06/16/2011 11:20 AM  Alejandro Kennedy  has presented today for surgery, with the diagnosis of LUQ pain and epigastric pain  The various methods of treatment have been discussed with the patient and family. After consideration of risks, benefits and other options for treatment, the patient has consented to  Procedure(s): ESOPHAGOGASTRODUODENOSCOPY (EGD) as a surgical intervention .  The patients' history has been reviewed, patient examined, no change in status, stable for surgery.  I have reviewed the patients' chart and labs.  Questions were answered to the patient's satisfaction.     [employer]

## 2011-06-16 NOTE — Op Note (Signed)
[facility] [address] [place], [place]  [address]  ENDOSCOPY PROCEDURE REPORT  PATIENT:  Alejandro Kennedy, Alejandro Kennedy  MR#:  [id number] BIRTHDATE:  [birth year], 30 yrs. old  GENDER:  male  ENDOSCOPIST:  Jonette Eva, MD Referred by:  Syliva Overman, M.D.  PROCEDURE DATE:  06/16/2011 PROCEDURE:  EGD with biopsy, 432[address] ASA CLASS: INDICATIONS:  abdominal pain CT NOV 2012 SB ENTERITIS-NONSPECIFIC, OTHERWISE NAIAP  MEDICATIONS:   Demerol 100 mg IV, Versed 4 mg IV TOPICAL ANESTHETIC:  Cetacaine Spray  DESCRIPTION OF PROCEDURE:     Physical exam was performed. Informed consent was obtained from the patient after explaining the benefits, risks, and alternatives to the procedure.  The patient was connected to the monitor and placed in the left lateral position.  Continuous oxygen was provided by nasal cannula and IV medicine administered through an indwelling cannula.  After administration of sedation, the patient's esophagus was intubated and the EG-[id number] ([id number]) endoscope was advanced under direct visualization to the second portion of the duodenum.  The scope was removed slowly by carefully examining the color, texture, anatomy, and integrity of the mucosa on the way out.  The patient was recovered in endoscopy and discharged home in satisfactory condition. <<PROCEDUREIMAGES>>  Mild gastritis was found & biopsed VIA COLD FOREPS. NL ESOPHAGUS AND DUODENUM.  COMPLICATIONS:    None  ENDOSCOPIC IMPRESSION: 1) Mild gastritis  RECOMMENDATIONS: PROTONIX qd await biopsies avoid dairy for 1 mo if bx neg, needs hbt  REPEAT EXAM:  No  ______________________________ Jonette Eva, MD  CC:  n. eSIGNED:   Aly Seidenberg at 06/16/2011 12:23 PM  Lanny Cramp, [phone]

## 2011-06-17 ENCOUNTER — Ambulatory Visit: Payer: BC Managed Care – PPO | Admitting: Family Medicine

## 2011-06-22 ENCOUNTER — Telehealth: Payer: Self-pay | Admitting: Gastroenterology

## 2011-06-22 NOTE — Telephone Encounter (Signed)
Please call pt. His stomach Bx shows mild gastritis. Continue PROTONIX 30 minutes prior to HIS FIRST meal. HE SHOULD HAVE A HYDROGEN BREATH TEST TO EVALUATE FOR LACTOSE INTOLERANCE. He should avoid NSAIDs. OPV in LATE FEB 2012.

## 2011-06-22 NOTE — Telephone Encounter (Signed)
Called and informed pt.  

## 2011-06-22 NOTE — Telephone Encounter (Signed)
Pt is aware of OV on 4/3 at 0830 with SF.  Do I need to [facility] this or is this appointment OK?

## 2011-06-23 NOTE — Telephone Encounter (Signed)
LMOVM

## 2011-06-23 NOTE — Telephone Encounter (Signed)
Results Cc to PCP  

## 2011-06-24 ENCOUNTER — Encounter (HOSPITAL_COMMUNITY): Payer: Self-pay | Admitting: Gastroenterology

## 2011-06-24 NOTE — Telephone Encounter (Signed)
Attempted to call pt again- No answer- LMOVM

## 2011-06-28 ENCOUNTER — Encounter: Payer: Self-pay | Admitting: Gastroenterology

## 2011-06-28 NOTE — Telephone Encounter (Signed)
Called pt again- no answer- will mail letter

## 2011-07-01 ENCOUNTER — Encounter: Payer: Self-pay | Admitting: Gastroenterology

## 2011-07-01 ENCOUNTER — Other Ambulatory Visit: Payer: Self-pay | Admitting: Gastroenterology

## 2011-07-01 DIAGNOSIS — R140 Abdominal distension (gaseous): Secondary | ICD-10-CM

## 2011-07-14 ENCOUNTER — Encounter (HOSPITAL_COMMUNITY)
Admission: RE | Disposition: A | Discharge status: Home / Self Care | Payer: Self-pay | Source: Ambulatory Visit | Attending: Gastroenterology

## 2011-07-14 ENCOUNTER — Ambulatory Visit (HOSPITAL_COMMUNITY)
Admission: RE | Admit: 2011-07-14 | Discharge: 2011-07-14 | Disposition: A | Discharge status: Home / Self Care | Payer: BC Managed Care – PPO | Source: Ambulatory Visit | Attending: Gastroenterology | Admitting: Gastroenterology

## 2011-07-14 DIAGNOSIS — R197 Diarrhea, unspecified: Secondary | ICD-10-CM | POA: Insufficient documentation

## 2011-07-14 DIAGNOSIS — R143 Flatulence: Secondary | ICD-10-CM

## 2011-07-14 DIAGNOSIS — R141 Gas pain: Secondary | ICD-10-CM | POA: Insufficient documentation

## 2011-07-14 DIAGNOSIS — R142 Eructation: Secondary | ICD-10-CM

## 2011-07-14 HISTORY — PX: HYDROGEN BREATH TEST: SHX5529

## 2011-07-14 SURGERY — HYDROGEN BREATH TEST
Anesthesia: LOCAL

## 2011-07-14 MED ORDER — LACTULOSE 10 GM/15ML PO SOLN
ORAL | Status: AC
  Filled 2011-07-14: qty 60

## 2011-07-15 ENCOUNTER — Ambulatory Visit (INDEPENDENT_AMBULATORY_CARE_PROVIDER_SITE_OTHER): Payer: BC Managed Care – PPO | Admitting: Family Medicine

## 2011-07-15 ENCOUNTER — Encounter: Payer: Self-pay | Admitting: Family Medicine

## 2011-07-15 ENCOUNTER — Telehealth: Payer: Self-pay | Admitting: Gastroenterology

## 2011-07-15 DIAGNOSIS — L989 Disorder of the skin and subcutaneous tissue, unspecified: Secondary | ICD-10-CM

## 2011-07-15 DIAGNOSIS — L659 Nonscarring hair loss, unspecified: Secondary | ICD-10-CM

## 2011-07-15 MED ORDER — KETOCONAZOLE 200 MG PO TABS: 200.0000 mg | ORAL_TABLET | Freq: Every day | ORAL | Status: DC

## 2011-07-15 MED ORDER — CLOTRIMAZOLE 1 % EX CREA: TOPICAL_CREAM | Freq: Two times a day (BID) | CUTANEOUS | Status: DC

## 2011-07-15 NOTE — Telephone Encounter (Signed)
PLEASE CALL PT. HIS HYDROGEN BREATH TEST SHOWS NO EVIDENCE FOR LACTOSE INTOLERANCE  HE SHOULD:  1. CONTINUE PROTONIX. 2. ADD A PROBIOTIC DAILY. 3. Follow up in FEB 2013.

## 2011-07-15 NOTE — Telephone Encounter (Signed)
Results Cc to PCP  

## 2011-07-15 NOTE — Op Note (Addendum)
PRE-OPERATIVE DIAGNOSIS:  BLOATING, RARE DIARRHEA  POST-OPERATIVE DIAGNOSIS:  NO EVIDENCE FOR LACTOSE INTOLERANCE  PROCEDURE:  Procedure(s): HYDROGEN BREATH TEST FOR LACTOSE INTOLERNACE  SURGEON:  Surgeon(s): Arlyce Harman, MD  MEDICATIONS USED:  12 OZ MILK 0600  FINDINGS: BREATHALYZER- BASELINE 0900 (3 PPM), 0905(3 PPM)  DIAGNOSIS: NO EVIDENCE FOR LACTOSE INTOLERANCE  PLAN OF CARE:  1. CONTINUE PROTONIX. 2. ADD A PROBIOTIC DAILY. 3. Follow up in FEB 2013

## 2011-07-15 NOTE — Patient Instructions (Signed)
I will refer him to dermatology to have your scan looked at. Take the fungal pill and then follow with the cream twice a day If he noticed worsening rash before year dermatology visit please call Please get your cholesterol drawn when you can- you need to be fasting

## 2011-07-15 NOTE — Progress Notes (Signed)
  Subjective:    Patient ID: Alejandro Kennedy, male    DOB: 01-Jul-1980, 31 y.o.   MRN: [id number]  HPI Past month has noticed balding spots on chin and scalp, does not feel ill, no rash present, no fever, no joint pain. He has male pattern balding which is unchanged. Pt tends to get boils and ingrown hairs in beard, he has had a large mass of hair/cystic lesion removed from beneath chin in the past  Knots- he has small knots under his skin on shoulder and buttocks and leg which have been there for years, he tends to scratch the one on his right shoulder. They do not drain  Abdominal pain-Treated for gastritis and PUD by GI, awaiting EGD results, doing well on lactose free diet. No pain currently  Review of Systems   GEN- denies fatigue, fever, weight loss,weakness, recent illness HEENT- denies eye drainage, change in vision, nasal discharge, CVS- denies chest pain, palpitations RESP- denies SOB, cough, wheeze ABD- denies N/V, change in stools, abd pain MSK- denies joint pain, muscle aches, injury Neuro- denies headache, dizziness, syncope, seizure activity       Objective:   Physical Exam  GEN- NAD, alert and oriented x 3, obese  SKIN-- Scattered dime size and quarter size , spots of aloepcia in beard and at temporal regions of     hair. Mild flaking in scalp, Acne vulgaris. Right shoulder excoriated lesion, left shoulder, right buttocks, Right knee- rubbery feeling 2-44mm nodules felt, no erythema, non tender, non fluctuant -prominent veins behind left knee EXT-  no edema NODES- no cervical nodes        Assessment & Plan:

## 2011-07-16 DIAGNOSIS — L989 Disorder of the skin and subcutaneous tissue, unspecified: Secondary | ICD-10-CM | POA: Insufficient documentation

## 2011-07-16 DIAGNOSIS — L659 Nonscarring hair loss, unspecified: Secondary | ICD-10-CM | POA: Insufficient documentation

## 2011-07-16 NOTE — Telephone Encounter (Signed)
Pt has OV for 09/15/11 at 0830 with SF. Do I still need to make OV for February? Please advise.

## 2011-07-16 NOTE — Assessment & Plan Note (Signed)
Differentials alopecia barbare, alopecia areata, vs tinea infection causing alopecia. Will give one oral dose of anti-fungal and start on clotrimazole. Pt will be referred to dermatology. He is an otherwise healthy male.

## 2011-07-16 NOTE — Assessment & Plan Note (Signed)
He has multiple fibrotic feeling lesions- ? fibroadenoma though scattered and no history of trauma. Will refer to derm, they have been present for many years.

## 2011-07-16 NOTE — Telephone Encounter (Signed)
Called and informed pt.  

## 2011-07-26 ENCOUNTER — Encounter (HOSPITAL_COMMUNITY): Payer: Self-pay | Admitting: Gastroenterology

## 2011-09-15 ENCOUNTER — Ambulatory Visit: Payer: BC Managed Care – PPO | Admitting: Gastroenterology

## 2011-09-15 ENCOUNTER — Telehealth: Payer: Self-pay | Admitting: Gastroenterology

## 2011-09-15 NOTE — Telephone Encounter (Signed)
Pt was a no show

## 2011-11-29 ENCOUNTER — Ambulatory Visit (INDEPENDENT_AMBULATORY_CARE_PROVIDER_SITE_OTHER): Payer: BC Managed Care – PPO | Admitting: Orthopedic Surgery

## 2011-11-29 ENCOUNTER — Encounter: Payer: Self-pay | Admitting: Orthopedic Surgery

## 2011-11-29 VITALS — BP 122/66 | Ht 76.0 in | Wt 322.0 lb

## 2011-11-29 DIAGNOSIS — S6990XA Unspecified injury of unspecified wrist, hand and finger(s), initial encounter: Secondary | ICD-10-CM

## 2011-11-29 DIAGNOSIS — S59909A Unspecified injury of unspecified elbow, initial encounter: Secondary | ICD-10-CM

## 2011-11-29 MED ORDER — HYDROCODONE-ACETAMINOPHEN 7.5-325 MG PO TABS: 1.0000 | ORAL_TABLET | ORAL | Status: AC | PRN

## 2011-11-29 MED ORDER — IBUPROFEN 800 MG PO TABS: 800.0000 mg | ORAL_TABLET | Freq: Three times a day (TID) | ORAL | Status: AC | PRN

## 2011-11-29 NOTE — Progress Notes (Signed)
Patient ID: Alejandro Kennedy, male   DOB: 12/22/1980, 31 y.o.   MRN: [id number]  Chief Complaint  Patient presents with  . 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 He was told he had an elbow fracture. It is difficult to find a fracture on the x-ray and it was read as no fracture on the x-ray report.  Complains of lateral pain.  Sharp throbbing, stabbing, burning.  6.  Constant.  Swelling.  No numbness or tingling.  Pain is posterior and anterior feels like it is deep in the joint.  Review of systems fever, fatigue, joint pain and swelling. All other systems are negative The following portions of the patient's history were reviewed and updated as appropriate: allergies, current medications, past family history, past medical history, past social history, past surgical history and problem list.   Review of Systems Pertinent items are noted in HPI.   Objective:    BP 122/72  Ht 4\' 11"  (1.499 m)  Wt 61.236 kg (135 lb)  BMI 27.27 kg/m2        Vital signs are stable as recorded  General appearance is normal  The patient is alert and oriented x3  The patient's mood and affect are normal  Gait assessment: normal  The cardiovascular exam reveals normal pulses and temperature without edema swelling.  The lymphatic system is negative for palpable lymph nodes  The sensory exam is normal.  There are no pathologic reflexes.  Balance is normal.   Exam of the RIGHT elbow. I was able to extend his elbow to 10 flexion 110. Tenderness over the medial epicondyle and posterior at the olecranon lateral side, nontender. Stability seems normal. Strength and muscle tone is normal. Skin is intact.    The x-ray is inconclusive. There appears to be some irregularity at the proximal portion of the ulna.    Assessment:    Right injury cannot rule out fracture    Plan:    recommend sling for  comfort. 3 times a day. Flexion-extension exercises, and I have emphasized the significance of this to prevent elbow stiffness. No work for 2 weeks so he can do his exercises.

## 2011-11-29 NOTE — Patient Instructions (Addendum)
Do 25 elbow flexion extensions 3 x a day   Wear sling for comfort   Remove when pain improves   Out of work note: 6/12 -6/26

## 2011-12-07 ENCOUNTER — Ambulatory Visit (INDEPENDENT_AMBULATORY_CARE_PROVIDER_SITE_OTHER): Payer: BC Managed Care – PPO | Admitting: Orthopedic Surgery

## 2011-12-07 ENCOUNTER — Encounter: Payer: Self-pay | Admitting: Orthopedic Surgery

## 2011-12-07 VITALS — BP 128/80 | Ht 76.0 in | Wt 322.0 lb

## 2011-12-07 DIAGNOSIS — S52123A Displaced fracture of head of unspecified radius, initial encounter for closed fracture: Secondary | ICD-10-CM

## 2011-12-07 DIAGNOSIS — M25529 Pain in unspecified elbow: Secondary | ICD-10-CM

## 2011-12-07 NOTE — Patient Instructions (Signed)
RTW FRI June 28TH

## 2011-12-07 NOTE — Progress Notes (Signed)
Patient ID: Alejandro Kennedy, male   DOB: Nov 11, 1980, 31 y.o.   MRN: [id number] Chief Complaint  Patient presents with  . Follow-up    recheck right elbow, DOI 11/24/11    BP 128/80  Ht 6\' 4"  (1.93 m)  Wt 322 lb (146.058 kg)  BMI 39.20 kg/m2  RIGHT elbow injury. Could not rule out possibility of fracture.  Started on range of motion exercises and appears to be improving. He does have some soreness when he overworks his arm, nor at the extremes of motion.  Extension is been restored normal flexion is restricted normal with passive range of motion. He is minimal discomfort with pronation and supination.  Overall tenderness has improved around the bony prominences of the elbow.  Impression possible radial head fracture versus severe contusion/sprain.  Recommend return to work on June 28 and the patient is comfortable with that date.

## 2012-05-22 ENCOUNTER — Emergency Department (HOSPITAL_COMMUNITY): Payer: BC Managed Care – PPO

## 2012-05-22 ENCOUNTER — Emergency Department (HOSPITAL_COMMUNITY)
Admission: EM | Admit: 2012-05-22 | Discharge: 2012-05-22 | Disposition: A | Discharge status: Home / Self Care | Payer: BC Managed Care – PPO | Attending: Emergency Medicine | Admitting: Emergency Medicine

## 2012-05-22 ENCOUNTER — Encounter (HOSPITAL_COMMUNITY): Payer: Self-pay | Admitting: Emergency Medicine

## 2012-05-22 DIAGNOSIS — J111 Influenza due to unidentified influenza virus with other respiratory manifestations: Secondary | ICD-10-CM | POA: Insufficient documentation

## 2012-05-22 DIAGNOSIS — R6889 Other general symptoms and signs: Secondary | ICD-10-CM

## 2012-05-22 DIAGNOSIS — J3489 Other specified disorders of nose and nasal sinuses: Secondary | ICD-10-CM | POA: Insufficient documentation

## 2012-05-22 DIAGNOSIS — R51 Headache: Secondary | ICD-10-CM | POA: Insufficient documentation

## 2012-05-22 DIAGNOSIS — R197 Diarrhea, unspecified: Secondary | ICD-10-CM | POA: Insufficient documentation

## 2012-05-22 DIAGNOSIS — Z8669 Personal history of other diseases of the nervous system and sense organs: Secondary | ICD-10-CM | POA: Insufficient documentation

## 2012-05-22 DIAGNOSIS — R112 Nausea with vomiting, unspecified: Secondary | ICD-10-CM | POA: Insufficient documentation

## 2012-05-22 DIAGNOSIS — Z8719 Personal history of other diseases of the digestive system: Secondary | ICD-10-CM | POA: Insufficient documentation

## 2012-05-22 MED ORDER — ONDANSETRON HCL 8 MG PO TABS: 8.0000 mg | ORAL_TABLET | Freq: Three times a day (TID) | ORAL | Status: DC | PRN

## 2012-05-22 MED ORDER — HYDROCOD POLST-CHLORPHEN POLST 10-8 MG/5ML PO LQCR: 5.0000 mL | Freq: Two times a day (BID) | ORAL | Status: DC

## 2012-05-22 MED ORDER — AZITHROMYCIN 250 MG PO TABS: ORAL_TABLET | ORAL | Status: DC

## 2012-05-22 NOTE — ED Notes (Signed)
Pt reports being sick w. Nasal congestion and cough since Friday, some mild nausea and vomiting at times, nad noted at arrival

## 2012-05-22 NOTE — ED Notes (Signed)
Symptoms began on Friday. Pt states has had a brownish color productive cough with nasal congestion, sore throat and some vomiting and diarrhea.

## 2012-05-24 NOTE — ED Provider Notes (Signed)
History     CSN: [id number]  Arrival date & time 05/22/12  0915   First MD Initiated Contact with Patient 05/22/12 [phone]      Chief Complaint  Patient presents with  . Cough  . Nasal Congestion  . Diarrhea  . Emesis    (Consider location/radiation/quality/duration/timing/severity/associated sxs/prior treatment) HPI Comments: AZEL GUMINA presents with a 3 day history of cough,  Nasal congestion with post nasal drip, and nasal discharge which has been thick,  occasionally dark brown in color without blood,   Generalized body aches and has had several episodes of post tussive vomiting.  He also reports loose stools yesterday,  None today despite no treatment for this.  He has had chills without documented fevers.   He denies chest pain, shortness of breath, abdominal pain.  He has taken tylenol which gives transient relief.  The history is provided by the patient.    Past Medical History  Diagnosis Date  . Ulcer     per pt was told he had an ulcer, no prior EGD  . Generalized headaches   . Sleep apnea   . Hepatic steatosis     Past Surgical History  Procedure Date  . Cyst removed 2004    from throat  . Esophagogastroduodenoscopy 06/16/2011    Procedure: ESOPHAGOGASTRODUODENOSCOPY (EGD);  Surgeon: Arlyce Harman, MD;  Location: [facility];  Service: Endoscopy;  Laterality: N/A;  10:30  . Hydrogen breath test 07/14/2011    Procedure: HYDROGEN BREATH TEST;  Surgeon: Arlyce Harman, MD;  Location: [facility];  Service: Endoscopy;  Laterality: N/A;  EVALUATE FOR LACTOSE INTOLERANCE     Family History  Problem Relation Age of Onset  . Diabetes Mother   . Hypertension Mother   . Diabetes Father   . Colon cancer Neg Hx   . Heart disease    . Kidney disease    . Arthritis      History  Substance Use Topics  . Smoking status: Never Smoker   . Smokeless tobacco: Not on file  . Alcohol Use: Yes     Comment: rarely      Review of Systems  Constitutional: Negative  for fever.  HENT: Positive for congestion, sore throat and rhinorrhea. Negative for neck pain.   Eyes: Negative.   Respiratory: Negative for chest tightness and shortness of breath.   Cardiovascular: Negative for chest pain.  Gastrointestinal: Positive for nausea, vomiting and diarrhea. Negative for abdominal pain.  Genitourinary: Negative.   Musculoskeletal: Negative for joint swelling and arthralgias.  Skin: Negative.  Negative for rash and wound.  Neurological: Negative for dizziness, weakness, light-headedness, numbness and headaches.  Hematological: Negative.   Psychiatric/Behavioral: Negative.     Allergies  Bee venom  Home Medications   Current Outpatient Rx  Name  Route  Sig  Dispense  Refill  . AZITHROMYCIN 250 MG PO TABS      Take 2 tabs on day one,  Then 1 tablet daily for 4 more days.   6 tablet   0   . HYDROCOD POLST-CPM POLST ER 10-8 MG/5ML PO LQCR   Oral   Take 5 mLs by mouth every 12 (twelve) hours.   75 mL   0   . ONDANSETRON HCL 8 MG PO TABS   Oral   Take 1 tablet (8 mg total) by mouth every 8 (eight) hours as needed for nausea.   12 tablet   0     BP 128/76  Pulse 77  Temp 98.8 F (37.1 C) (Oral)  Resp 18  Ht 6\' 4"  (1.93 m)  Wt 330 lb (149.687 kg)  BMI 40.17 kg/m2  SpO2 97%  Physical Exam  Nursing note and vitals reviewed. Constitutional: He is oriented to person, place, and time. He appears well-developed and well-nourished.  HENT:  Head: Normocephalic and atraumatic.  Right Ear: Tympanic membrane and ear canal normal.  Left Ear: Tympanic membrane and ear canal normal.  Nose: Mucosal edema and rhinorrhea present. Right sinus exhibits maxillary sinus tenderness. Right sinus exhibits no frontal sinus tenderness. Left sinus exhibits maxillary sinus tenderness. Left sinus exhibits no frontal sinus tenderness.  Mouth/Throat: Uvula is midline, oropharynx is clear and moist and mucous membranes are normal. No oropharyngeal exudate, posterior  oropharyngeal edema, posterior oropharyngeal erythema or tonsillar abscesses.  Eyes: Conjunctivae normal are normal.  Neck: Normal range of motion.  Cardiovascular: Normal rate, regular rhythm, normal heart sounds and intact distal pulses.   Pulmonary/Chest: Effort normal and breath sounds normal. No respiratory distress. He has no wheezes. He has no rhonchi. He has no rales.  Abdominal: Soft. Bowel sounds are normal. There is no tenderness. There is no rebound.  Musculoskeletal: Normal range of motion.  Neurological: He is alert and oriented to person, place, and time.  Skin: Skin is warm and dry. No rash noted.  Psychiatric: He has a normal mood and affect.    ED Course  Procedures (including critical care time)  Labs Reviewed - No data to display No results found.   1. Flu-like symptoms       MDM  Pt in no distress with no findings on exam except for nasal congestion with some maxillary ttp.  Suspect sinusitis,  Although cough and GI Sx fit the pattern of the viral syndrome that has been so prevalent over the past week.  He was encouraged to rest,  Drink plenty of fluids, he was prescribed tussionex for cough, zofran for nausea.  He tolerated PO fluids here without emesis.  Recheck if sx are not improved,  Or if they worsen over the next several days.        Burgess Amor, [place] 05/24/12 [phone]

## 2012-05-26 NOTE — ED Provider Notes (Signed)
Medical screening examination/treatment/procedure(s) were performed by non-physician practitioner and as supervising physician I was immediately available for consultation/collaboration.  Tacarra Justo, MD 05/26/12 0001 

## 2013-06-04 ENCOUNTER — Encounter: Payer: Self-pay | Admitting: Family Medicine

## 2013-06-04 ENCOUNTER — Ambulatory Visit (INDEPENDENT_AMBULATORY_CARE_PROVIDER_SITE_OTHER): Payer: BC Managed Care – PPO | Admitting: Family Medicine

## 2013-06-04 VITALS — BP 138/90 | HR 88 | Temp 98.4°F | Resp 22 | Ht 75.0 in | Wt 357.0 lb

## 2013-06-04 DIAGNOSIS — K219 Gastro-esophageal reflux disease without esophagitis: Secondary | ICD-10-CM

## 2013-06-04 DIAGNOSIS — J069 Acute upper respiratory infection, unspecified: Secondary | ICD-10-CM

## 2013-06-04 DIAGNOSIS — M5430 Sciatica, unspecified side: Secondary | ICD-10-CM | POA: Insufficient documentation

## 2013-06-04 DIAGNOSIS — E785 Hyperlipidemia, unspecified: Secondary | ICD-10-CM

## 2013-06-04 DIAGNOSIS — R5381 Other malaise: Secondary | ICD-10-CM | POA: Insufficient documentation

## 2013-06-04 DIAGNOSIS — M5432 Sciatica, left side: Secondary | ICD-10-CM

## 2013-06-04 MED ORDER — PANTOPRAZOLE SODIUM 40 MG PO TBEC: 40.0000 mg | DELAYED_RELEASE_TABLET | Freq: Every day | ORAL | Status: AC

## 2013-06-04 MED ORDER — AZITHROMYCIN 250 MG PO TABS: ORAL_TABLET | ORAL | Status: DC

## 2013-06-04 NOTE — Assessment & Plan Note (Signed)
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 I will place him on azithromycin he can continue his over-the-counter cough medications

## 2013-06-04 NOTE — Patient Instructions (Signed)
Try aleve or advil twice a day as needed Start protonix Take antibiotics as prescribed Return for fasting bloodwork  F/U 8 weeks - Blood pressure

## 2013-06-04 NOTE — Assessment & Plan Note (Signed)
Check fasting lipid panel 

## 2013-06-04 NOTE — Assessment & Plan Note (Signed)
Symptoms are not persistent at this time. He has no back injury in his exam is fairly normal. He can take anti-inflammatories although she has his acid reflux medication. We will also consider gabapentin if this does not help. He understands that weight loss will also improve his symptoms

## 2013-06-04 NOTE — Progress Notes (Signed)
   Subjective:    Patient ID: Alejandro Kennedy, male    DOB: Jul 27, 1980, 32 y.o.   MRN: [id number]  HPI  Patient here with multiple concerns. For the past 4-5 days he's had cough with production as well as some sinus drainage. 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 He has not had any fever. He does note that his cough is worse first thing in the morning and at night but this is relieved with use of NyQuil for  He's also concerned about metallic taste in the back of his mouth as well as some acid reflux. This is worsened over the past couple months. He does not have any specific foods that cause of symptoms but notes that he has a burning sensation with spicy foods. He's had 2 episodes where he had emesis into his mouth due to the reflux and metallic taste. In the past he was on Prilosec however this did not help therefore he was switched to protonix. which did help.  He is concerned about his weight gain. He was down to 318 pounds and is now at 357 pounds. He feels like he is hungry all the time he cannot stop eating. He's also had a few episodes of polydipsia. Denies any polyuria. He does have a family history of diabetes mellitus. He was working out running 1-2 miles a day however he would get sharp pains from his left side or radiate down his leg and resolve. He also is very fatigued after his 12 hour shifts therefore has not been working out recently. Denies any injury to his back or his hip. The episodes last a couple of hours and then resolve especially when he rests.  No change in bowel or bladder   Review of Systems  GEN- + fatigue, fever, weight loss,weakness, recent illness HEENT- denies eye drainage, change in vision,+ nasal discharge, CVS- denies chest pain, palpitations RESP- denies SOB,+ cough, wheeze ABD- denies N/V, change in stools, abd pain GU- denies dysuria, hematuria, dribbling, incontinence MSK- + joint pain, muscle  aches, injury Neuro- denies headache, dizziness, syncope, seizure activity      Objective:   Physical Exam  GEN- NAD, alert and oriented x3 HEENT- PERRL, EOMI, non injected sclera, pink conjunctiva, MMM, oropharynx mild injection, TM clear bilat no effusion,  + maxillary sinus tenderness, inflammed turbinates,  Nasal drainage  Neck- Supple, no LAD CVS- RRR, no murmur RESP-CTAB ABD-NABS,soft,NT,ND MSK- Spine NT, FROM, neg SLR, Hip- FROM, mild pain with IR left side Neuro- Tone normal LE, DTR symmetric, strength equal bilat, sensation grossly in tact EXT- No edema Pulses- Radial 2+         Assessment & Plan:

## 2013-06-04 NOTE — Assessment & Plan Note (Signed)
Labs to be checked, and multifactorial due to stress at his work as well as his weight gain

## 2013-06-04 NOTE — Assessment & Plan Note (Signed)
Weight gain noted- check glucose and TSH Need to improve diet and exercise

## 2013-06-04 NOTE — Assessment & Plan Note (Signed)
He has failed TUMS, Zantac as well as Prilosec. Will start him on protonic 40 mg daily which he has done well with in the past.

## 2013-06-05 ENCOUNTER — Other Ambulatory Visit: Payer: BC Managed Care – PPO

## 2013-06-05 LAB — CBC WITH DIFFERENTIAL/PLATELET
Eosinophils Absolute: 0.1 10*3/uL (ref 0.0–0.7)
Eosinophils Relative: 2 % (ref 0–5)
HCT: 45 % (ref 39.0–52.0)
Hemoglobin: 15.3 g/dL (ref 13.0–17.0)
Lymphocytes Relative: 34 % (ref 12–46)
Lymphs Abs: 2.1 10*3/uL (ref 0.7–4.0)
MCH: 29.9 pg (ref 26.0–34.0)
MCHC: 34 g/dL (ref 30.0–36.0)
MCV: 87.9 fL (ref 78.0–100.0)
Monocytes Absolute: 0.6 10*3/uL (ref 0.1–1.0)
RBC: 5.12 MIL/uL (ref 4.22–5.81)
WBC: 6.2 10*3/uL (ref 4.0–10.5)

## 2013-06-05 LAB — COMPREHENSIVE METABOLIC PANEL
AST: 30 U/L (ref 0–37)
BUN: 13 mg/dL (ref 6–23)
CO2: 28 mEq/L (ref 19–32)
Creat: 0.85 mg/dL (ref 0.50–1.35)
Glucose, Bld: 94 mg/dL (ref 70–99)
Total Bilirubin: 1.2 mg/dL (ref 0.3–1.2)

## 2013-06-05 LAB — LIPID PANEL
HDL: 28 mg/dL — ABNORMAL LOW (ref >39)
LDL Cholesterol: 108 mg/dL — ABNORMAL HIGH (ref 0–99)
Total CHOL/HDL Ratio: 6.4 Ratio
Triglycerides: 217 mg/dL — ABNORMAL HIGH (ref <150)

## 2013-08-02 ENCOUNTER — Encounter: Payer: Self-pay | Admitting: Physician Assistant

## 2013-08-02 ENCOUNTER — Ambulatory Visit (INDEPENDENT_AMBULATORY_CARE_PROVIDER_SITE_OTHER): Payer: BC Managed Care – PPO | Admitting: Physician Assistant

## 2013-08-02 VITALS — BP 134/70 | HR 80 | Temp 98.7°F | Resp 18 | Ht 75.5 in | Wt 356.0 lb

## 2013-08-02 DIAGNOSIS — B9689 Other specified bacterial agents as the cause of diseases classified elsewhere: Secondary | ICD-10-CM

## 2013-08-02 DIAGNOSIS — A499 Bacterial infection, unspecified: Secondary | ICD-10-CM

## 2013-08-02 DIAGNOSIS — R509 Fever, unspecified: Secondary | ICD-10-CM

## 2013-08-02 DIAGNOSIS — J988 Other specified respiratory disorders: Secondary | ICD-10-CM

## 2013-08-02 DIAGNOSIS — A088 Other specified intestinal infections: Secondary | ICD-10-CM

## 2013-08-02 DIAGNOSIS — A084 Viral intestinal infection, unspecified: Secondary | ICD-10-CM

## 2013-08-02 LAB — INFLUENZA A AND B
INFLUENZA A AG: NEGATIVE
Influenza B Ag: NEGATIVE

## 2013-08-02 MED ORDER — AZITHROMYCIN 250 MG PO TABS: ORAL_TABLET | ORAL | Status: DC

## 2013-08-03 NOTE — Progress Notes (Signed)
Patient ID: Alejandro Kennedy MRN: [id number], DOB: 1981-02-09, 33 y.o. Date of Encounter: 08/03/2013, 3:22 PM    Chief Complaint:  Chief Complaint  Patient presents with  . c/o severe sinus/chest cold    x sev weeks, now with diarrhea, fatique, body aches     HPI: 33 y.o. year old white male says he has had some cold-type symptoms for several weeks. Has had head and chest congestion. Postnasal mucus as well as phlegm in his chest.   However, all of a sudden this morning, he felt as if he had been "hit by a truck." Feels extremely exhausted.  Just today he suddenly developed diarrhea. Has had 6-7 episodes already today. Has had no fevers or chills. No localized/focal abdominal pain. No vomiting.     Home Meds: See attached medication section for any medications that were entered at today's visit. The computer does not put those onto this list.The following list is a list of meds entered prior to today's visit.   Current Outpatient Prescriptions on File Prior to Visit  Medication Sig Dispense Refill  . pantoprazole (PROTONIX) 40 MG tablet Take 1 tablet (40 mg total) by mouth daily.  30 tablet  6   No current facility-administered medications on file prior to visit.    Allergies:  Allergies  Allergen Reactions  . Bee Venom Swelling      Review of Systems: See HPI for pertinent ROS. All other ROS negative.    Physical Exam: Blood pressure 134/70, pulse 80, temperature 98.7 F (37.1 C), temperature source Oral, resp. rate 18, height 6' 3.5" (1.918 m), weight 356 lb (161.481 kg)., Body mass index is 43.9 kg/(m^2). General:  Large WM Appears in no acute distress. HEENT: Normocephalic, atraumatic, eyes without discharge, sclera non-icteric, nares are without discharge. Bilateral auditory canals clear, TM's are without perforation, pearly grey and translucent with reflective cone of light bilaterally. Oral cavity moist, posterior pharynx without exudate, erythema, peritonsillar  abscess. No tenderness with percussion of frontal or maxillary sinuses.  Neck: Supple. No thyromegaly. No lymphadenopathy. Lungs: Clear bilaterally to auscultation without wheezes, rales, or rhonchi. Breathing is unlabored. Heart: Regular rhythm. No murmurs, rubs, or gallops. Abdomen: Soft, non-tender, non-distended with normoactive bowel sounds. No hepatomegaly. No rebound/guarding. No obvious abdominal mass. I firmly  palpated entire abdomen and there is no area of tenderness. Msk:  Strength and tone normal for age. Extremities/Skin: Warm and dry. Neuro: Alert and oriented X 3. Moves all extremities spontaneously. Gait is normal. CNII-XII grossly in tact. Psych:  Responds to questions appropriately with a normal affect.   Results for orders placed in visit on 08/02/13  INFLUENZA A AND B      Result Value Ref Range   Source-INFBD NASAL     Inflenza A Ag NEG  Negative   Influenza B Ag NEG  Negative     ASSESSMENT AND PLAN:  33 y.o. year old male with  1. Viral gastroenteritis  2. Bacterial respiratory infection - azithromycin (ZITHROMAX) 250 MG tablet; Day 1: Take 2 daily. Days 2-5: Take 1 daily.  Dispense: 6 tablet; Refill: 0  3. Fever, unspecified - Influenza a and b  First: He needs to drink frequent small sips of fluid to prevent dehydration. Stay with clear liquid diet until diarrhea resolves. Then can gradually advance to bland diet as tolerated. Once the diarrhea has completely resolved, and he is tolerating regular diet, then he can start antibiotic. He will be to complete the antibiotic. If congestion  does not resolve within one week after completion of antibiotic and followup. If diarrhea persist greater than 3 days without improvement and followup. As well as he develops localized/focal abdominal pain or fevers and chills then followup.   Murray HodgkinsSigned, Curties Conigliaro Beth KiowaDixon, [place], [facility] 08/03/2013 3:22 PM

## 2013-08-06 ENCOUNTER — Ambulatory Visit: Payer: BC Managed Care – PPO | Admitting: Family Medicine

## 2013-08-13 ENCOUNTER — Ambulatory Visit (INDEPENDENT_AMBULATORY_CARE_PROVIDER_SITE_OTHER): Payer: BC Managed Care – PPO | Admitting: Family Medicine

## 2013-08-13 ENCOUNTER — Encounter: Payer: Self-pay | Admitting: Family Medicine

## 2013-08-13 VITALS — BP 138/64 | HR 84 | Temp 98.9°F | Resp 22 | Ht 75.0 in | Wt 360.0 lb

## 2013-08-13 DIAGNOSIS — G4733 Obstructive sleep apnea (adult) (pediatric): Secondary | ICD-10-CM

## 2013-08-13 DIAGNOSIS — J019 Acute sinusitis, unspecified: Secondary | ICD-10-CM

## 2013-08-13 DIAGNOSIS — Z9989 Dependence on other enabling machines and devices: Secondary | ICD-10-CM

## 2013-08-13 MED ORDER — FLUTICASONE PROPIONATE 50 MCG/ACT NA SUSP: 2.0000 | Freq: Every day | NASAL | Status: DC

## 2013-08-13 MED ORDER — AMOXICILLIN-POT CLAVULANATE 875-125 MG PO TABS: 1.0000 | ORAL_TABLET | Freq: Two times a day (BID) | ORAL | Status: DC

## 2013-08-13 NOTE — Progress Notes (Signed)
Patient ID: Alejandro Kennedy, male   DOB: 01/13/1981, 33 y.o.   MRN: [id number]   Subjective:    Patient ID: Alejandro PiedraKyle M Krack, male    DOB: 10/25/1980, 33 y.o.   MRN: [id number]  Patient presents for illness  Patient here with continued sinusitis. He was treated a few weeks ago with azithromycin. He did not improve after the treatment. He continues to have thick drainage sometimes blood tinged on the left side. He also has a very sore throat feels raw from all the drainage going down. He has used some over-the-counter Allegra otherwise no other medications. He is very minimal cough and is not productive.  He notes that he does have some daytime fatigue and this may be due to not sleeping well he is using his CPAP machine for his sleep apnea and feels like he is resting with the machine on.   Review Of Systems:  GEN- + fatigue, fever, weight loss,weakness, recent illness HEENT- denies eye drainage, change in vision, +nasal discharge, CVS- denies chest pain, palpitations RESP- denies SOB, +cough, wheeze Neuro- denies headache, dizziness, syncope, seizure activity       Objective:    BP 138/64  Pulse 84  Temp(Src) 98.9 F (37.2 C)  Resp 22  Ht 6\' 3"  (1.905 m)  Wt 360 lb (163.295 kg)  BMI 45.00 kg/m2 GEN- NAD, alert and oriented x3 HEENT- PERRL, EOMI, non injected sclera, pink conjunctiva, MMM, oropharynx mild injection, TM clear bilat no effusion,  + maxillary sinus tenderness, inflammed turbinates,  Nasal drainage  Neck- Supple, shotty LAD CVS- RRR, no murmur RESP-CTAB EXT- No edema Pulses- Radial 2+         Assessment & Plan:      Problem List Items Addressed This Visit   OSA on CPAP     Continue CPAP     Other Visit Diagnoses   Acute sinusitis    -  Primary    He is not improved since the last treatment. I will put him on Augmentin as well as Flonase, + post nasal drip    Relevant Medications       AMOXICILLIN-POT CLAVULANATE 875-125 MG PO TABS       fluticasone  (FLONASE) 50 MCG nasal spray       Note: This dictation was prepared with Dragon dictation along with smaller phrase technology. Any transcriptional errors that result from this process are unintentional.

## 2013-08-13 NOTE — Patient Instructions (Signed)
Start antibiotics for sinuitis Use flonase Use Sudafed if needed Call if not better and CT of Sinus  F/u as needed

## 2013-08-13 NOTE — Assessment & Plan Note (Signed)
Continue CPAP.  

## 2013-08-31 ENCOUNTER — Telehealth: Payer: Self-pay | Admitting: *Deleted

## 2013-08-31 ENCOUNTER — Encounter: Payer: Self-pay | Admitting: Family Medicine

## 2013-08-31 MED ORDER — AMOXICILLIN-POT CLAVULANATE 875-125 MG PO TABS: 1.0000 | ORAL_TABLET | Freq: Two times a day (BID) | ORAL | Status: DC

## 2013-08-31 NOTE — Telephone Encounter (Signed)
He was give 10 days of augmentin on 3/2, okay to refill, he needs to do a second course, also continue flonase and add Sudafed over the counter

## 2013-08-31 NOTE — Telephone Encounter (Signed)
Prescription sent to pharmacy. .   Call placed to patient and patient made aware.  

## 2013-08-31 NOTE — Telephone Encounter (Signed)
Received e-mail from patient.   Reports that he completed Z- pack ordered on 08/13/2013 for sinusitis.   States that for a few days he appeared to be getting better, but now his Sx have returned and are just as bad as they were during visit.  Reports diminished hearing, sinus pressure, congestion and headache.  MD please advise.

## 2013-09-19 ENCOUNTER — Ambulatory Visit (INDEPENDENT_AMBULATORY_CARE_PROVIDER_SITE_OTHER): Payer: BC Managed Care – PPO | Admitting: Physician Assistant

## 2013-09-19 ENCOUNTER — Encounter: Payer: Self-pay | Admitting: Physician Assistant

## 2013-09-19 VITALS — BP 122/76 | HR 80 | Temp 97.3°F | Resp 18 | Ht 75.75 in | Wt 356.0 lb

## 2013-09-19 DIAGNOSIS — L259 Unspecified contact dermatitis, unspecified cause: Secondary | ICD-10-CM

## 2013-09-19 DIAGNOSIS — L239 Allergic contact dermatitis, unspecified cause: Secondary | ICD-10-CM

## 2013-09-19 MED ORDER — METHYLPREDNISOLONE ACETATE 80 MG/ML IJ SUSP
80.0000 mg | Freq: Once | INTRAMUSCULAR | Status: AC
  Administered 2013-09-19: 80 mg via INTRAMUSCULAR

## 2013-09-19 NOTE — Progress Notes (Signed)
    Patient ID: Alejandro Kennedy MRN: [id number], DOB: 09/23/1980, 33 y.o. Date of Encounter: 09/19/2013, 5:20 PM    Chief Complaint:  Chief Complaint  Patient presents with  . rash    all over limbs, from something in yard     HPI: 33 y.o. year old male reports that this itchy rash started on his arms on the night of Sunday 09/16/13.  Since then it has spread to his right thigh and his left eyebrow. Says last year he was trying to get rid of a vine in his yard and developed the same type of rash. He thought he had gotten rid of that vine but was doing some yard work the day prior to the onset of this itchy rash. Thinks that it is coming from that same type of vine. Has Had no wheezing no difficulty breathing no tightness in his throat.     Home Meds: See attached medication section for any medications that were entered at today's visit. The computer does not put those onto this list.The following list is a list of meds entered prior to today's visit.   Current Outpatient Prescriptions on File Prior to Visit  Medication Sig Dispense Refill  . fluticasone (FLONASE) 50 MCG/ACT nasal spray Place 2 sprays into both nostrils daily.  16 g  1  . Omega-3 Fatty Acids (FISH OIL) 1000 MG CAPS Take 1 capsule by mouth 2 (two) times daily.      . pantoprazole (PROTONIX) 40 MG tablet Take 1 tablet (40 mg total) by mouth daily.  30 tablet  6   No current facility-administered medications on file prior to visit.    Allergies:  Allergies  Allergen Reactions  . Bee Venom Swelling      Review of Systems: See HPI for pertinent ROS. All other ROS negative.    Physical Exam: Blood pressure 122/76, pulse 80, temperature 97.3 F (36.3 C), temperature source Oral, resp. rate 18, height 6' 3.75" (1.924 m), weight 356 lb (161.481 kg)., Body mass index is 43.62 kg/(m^2). General:  Tall and obese large white male Appears in no acute distress. Neck: Supple. No thyromegaly. No lymphadenopathy. Lungs: Clear  bilaterally to auscultation without wheezes, rales, or rhonchi. Breathing is unlabored. Heart: Regular rhythm. No murmurs, rubs, or gallops. Msk:  Strength and tone normal for age. Extremities/Skin: Warm and dry.  Bilateral forearms have scattered pink papules. Right medial thigh has an approximate 2 inch area with scattered pink papules. Above the left eyebrow has some pink urticaria. Neuro: Alert and oriented X 3. Moves all extremities spontaneously. Gait is normal. CNII-XII grossly in tact. Psych:  Responds to questions appropriately with a normal affect.     ASSESSMENT AND PLAN:  33 y.o. year old male with  1. Allergic dermatitis - methylPREDNISolone acetate (DEPO-MEDROL) injection 80 mg; Inject 1 mL (80 mg total) into the muscle once. Followup if rash does  not begin to resolve over the next 48 hours.  Also followup if it begins to resolve but then reoccurs.  Signed, [address]Mary Beth Wind GapDixon, [place], [facility] 09/19/2013 5:20 PM

## 2013-10-03 ENCOUNTER — Telehealth: Payer: Self-pay | Admitting: *Deleted

## 2013-10-03 NOTE — Telephone Encounter (Signed)
Received PA request from pharmacy for Protonix.   PA submitted.

## 2013-10-11 NOTE — Telephone Encounter (Signed)
Received PA determination.   PA approved.   Case ID: [id number]  09/12/2013- 10/11/2014.  Pharmacy made aware.

## 2015-09-15 ENCOUNTER — Emergency Department (HOSPITAL_COMMUNITY): Payer: BC Managed Care – PPO

## 2015-09-15 ENCOUNTER — Emergency Department (HOSPITAL_COMMUNITY)
Admission: EM | Admit: 2015-09-15 | Discharge: 2015-09-15 | Disposition: A | Discharge status: Home / Self Care | Payer: BC Managed Care – PPO | Attending: Emergency Medicine | Admitting: Emergency Medicine

## 2015-09-15 ENCOUNTER — Encounter (HOSPITAL_COMMUNITY): Payer: Self-pay | Admitting: Emergency Medicine

## 2015-09-15 DIAGNOSIS — R079 Chest pain, unspecified: Secondary | ICD-10-CM | POA: Insufficient documentation

## 2015-09-15 DIAGNOSIS — Z79899 Other long term (current) drug therapy: Secondary | ICD-10-CM | POA: Insufficient documentation

## 2015-09-15 DIAGNOSIS — R1012 Left upper quadrant pain: Secondary | ICD-10-CM | POA: Diagnosis present

## 2015-09-15 DIAGNOSIS — K5900 Constipation, unspecified: Secondary | ICD-10-CM | POA: Insufficient documentation

## 2015-09-15 LAB — URINALYSIS, ROUTINE W REFLEX MICROSCOPIC
Bilirubin Urine: NEGATIVE
GLUCOSE, UA: NEGATIVE mg/dL
Hgb urine dipstick: NEGATIVE
KETONES UR: NEGATIVE mg/dL
LEUKOCYTES UA: NEGATIVE
NITRITE: NEGATIVE
PH: 6 (ref 5.0–8.0)
Protein, ur: NEGATIVE mg/dL
SPECIFIC GRAVITY, URINE: 1.02 (ref 1.005–1.030)

## 2015-09-15 LAB — COMPREHENSIVE METABOLIC PANEL
ALBUMIN: 4 g/dL (ref 3.5–5.0)
ALT: 67 U/L — ABNORMAL HIGH (ref 17–63)
ANION GAP: 7 (ref 5–15)
AST: 39 U/L (ref 15–41)
Alkaline Phosphatase: 61 U/L (ref 38–126)
BUN: 14 mg/dL (ref 6–20)
CHLORIDE: 106 mmol/L (ref 101–111)
CO2: 27 mmol/L (ref 22–32)
Calcium: 8.9 mg/dL (ref 8.9–10.3)
Creatinine, Ser: 0.78 mg/dL (ref 0.61–1.24)
GFR calc Af Amer: >60 mL/min (ref >60)
GFR calc non Af Amer: >60 mL/min (ref >60)
GLUCOSE: 127 mg/dL — AB (ref 65–99)
POTASSIUM: 3.8 mmol/L (ref 3.5–5.1)
Sodium: 140 mmol/L (ref 135–145)
TOTAL PROTEIN: 7.4 g/dL (ref 6.5–8.1)
Total Bilirubin: 1.1 mg/dL (ref 0.3–1.2)

## 2015-09-15 LAB — DIFFERENTIAL
BASOS ABS: 0 10*3/uL (ref 0.0–0.1)
Basophils Relative: 0 %
Eosinophils Absolute: 0.2 10*3/uL (ref 0.0–0.7)
Eosinophils Relative: 2 %
LYMPHS ABS: 2.3 10*3/uL (ref 0.7–4.0)
LYMPHS PCT: 28 %
MONOS PCT: 8 %
Monocytes Absolute: 0.7 10*3/uL (ref 0.1–1.0)
NEUTROS ABS: 5 10*3/uL (ref 1.7–7.7)
NEUTROS PCT: 62 %

## 2015-09-15 LAB — CBC
HEMATOCRIT: 44.5 % (ref 39.0–52.0)
HEMOGLOBIN: 15.3 g/dL (ref 13.0–17.0)
MCH: 30.3 pg (ref 26.0–34.0)
MCHC: 34.4 g/dL (ref 30.0–36.0)
MCV: 88.1 fL (ref 78.0–100.0)
Platelets: 230 10*3/uL (ref 150–400)
RBC: 5.05 MIL/uL (ref 4.22–5.81)
RDW: 12.8 % (ref 11.5–15.5)
WBC: 8.2 10*3/uL (ref 4.0–10.5)

## 2015-09-15 LAB — LIPASE, BLOOD: LIPASE: 28 U/L (ref 11–51)

## 2015-09-15 LAB — TROPONIN I: Troponin I: <0.03 ng/mL (ref <0.031)

## 2015-09-15 MED ORDER — IOHEXOL 300 MG/ML  SOLN
125.0000 mL | Freq: Once | INTRAMUSCULAR | Status: AC | PRN
  Administered 2015-09-15: 125 mL via INTRAVENOUS

## 2015-09-15 MED ORDER — ONDANSETRON HCL 4 MG PO TABS: 4.0000 mg | ORAL_TABLET | Freq: Four times a day (QID) | ORAL | Status: AC

## 2015-09-15 NOTE — ED Provider Notes (Signed)
CSN: [id number]     Arrival date & time 09/15/15  1209 History  By signing my name below, I, Iona Beard, attest that this documentation has been prepared under the direction and in the presence of Pinkney Venard, PA-C.  Electronically Signed: Iona Beard, ED Scribe 09/15/2015 at 2:37 PM.  Chief Complaint  Patient presents with  . Abdominal Pain   The history is provided by the patient. No language interpreter was used.   HPI Comments: Alejandro Kennedy is a 35 y.o. male who presents to the Emergency Department complaining of gradual onset, 6/10, LUQ abdominal pain, ongoing for several weeks, worsening two days ago. Pt reports associated  diarrhea, and intermittent pain that radiates to his chest. He states at its worst the pain will radiate into his chest for about two minutes then resolve. He states that the pain in his abdomen feels as if there is a knot in the area. No other associated symptoms noted. When the pain began he states it was worsened with eating or drinking. Currently, he says the pain comes at random. No other worsening or alleviating factors noted. Pt denies shortness of breath, back pain, radiating pain into his arm, hx of heart problems, vomiting, nausea, fever or any other pertinent symptoms. Pt is a non smoker and rarely drinks. He reports no chest pain while in the ED.   Past Medical History  Diagnosis Date  . Ulcer     per pt was told he had an ulcer, no prior EGD  . Generalized headaches   . Sleep apnea   . Hepatic steatosis    Past Surgical History  Procedure Laterality Date  . Cyst removed  2004    from throat  . Esophagogastroduodenoscopy  06/16/2011    Procedure: ESOPHAGOGASTRODUODENOSCOPY (EGD);  Surgeon: Arlyce Harman, MD;  [facility];  Service: Endoscopy;  Laterality: N/A;  10:30  . Hydrogen breath test  07/14/2011    Procedure: HYDROGEN BREATH TEST;  Surgeon: Arlyce Harman, MD;  Location: [facility];  Service: Endoscopy;  Laterality:  N/A;  EVALUATE FOR LACTOSE INTOLERANCE    Family History  Problem Relation Age of Onset  . Diabetes Mother   . Hypertension Mother   . Diabetes Father   . Colon cancer Neg Hx   . Heart disease    . Kidney disease    . Arthritis     Social History  Substance Use Topics  . Smoking status: Never Smoker   . Smokeless tobacco: None  . Alcohol Use: Yes     Comment: rarely    Review of Systems  Constitutional: Negative for fever, activity change and appetite change.  HENT: Negative for congestion and sore throat.   Respiratory: Negative for chest tightness and shortness of breath.   Cardiovascular: Positive for chest pain. Negative for leg swelling.  Gastrointestinal: Positive for abdominal pain and diarrhea. Negative for nausea, vomiting and abdominal distention.  Genitourinary: Negative for dysuria, hematuria and flank pain.  Musculoskeletal: Negative for back pain.  Skin: Negative for rash.  Neurological: Negative for headaches.    Allergies  Review of patient's allergies indicates no active allergies.  Home Medications   Prior to Admission medications   Medication Sig Start Date End Date Taking? Authorizing Provider  Cholecalciferol (VITAMIN D-1000 MAX ST) 1000 units tablet Take 1,000 Units by mouth daily.    Yes Historical Provider, MD  fluticasone (FLONASE) 50 MCG/ACT nasal spray Place 2 sprays into both nostrils daily. 08/13/13  Yes Salley ScarletKawanta F [place], MD  Omega-3 Fatty Acids (FISH OIL) 1000 MG CAPS Take 1 capsule by mouth 2 (two) times daily.   Yes Historical Provider, MD  pantoprazole (PROTONIX) 40 MG tablet Take 1 tablet (40 mg total) by mouth daily. 06/04/13  Yes Salley ScarletKawanta F [place], MD   BP 151/76 mmHg  Pulse 64  Temp(Src) 97.8 F (36.6 C) (Oral)  Resp 16  Ht 6\' 4"  (1.93 m)  Wt 350 lb (158.759 kg)  BMI 42.62 kg/m2  SpO2 100% Physical Exam  Constitutional: He is oriented to person, place, and time. He appears well-developed and well-nourished. No distress.  HENT:   Head: Normocephalic.  Mouth/Throat: Oropharynx is clear and moist.  Eyes: EOM are normal.  Neck: Normal range of motion.  Cardiovascular: Normal rate, regular rhythm and normal heart sounds.   No murmur heard. Pulmonary/Chest: Effort normal and breath sounds normal. No respiratory distress. He has no wheezes. He has no rales.  Abdominal: Soft. Bowel sounds are normal. He exhibits no distension. There is no rebound and no guarding.  Diffuse,  TTP in left upper and lower quadrant.  No rebound tenderness or guarding  Musculoskeletal: Normal range of motion. He exhibits no edema or tenderness.  Neurological: He is alert and oriented to person, place, and time.  Skin: Skin is warm. No rash noted.  Psychiatric: He has a normal mood and affect.  Nursing note and vitals reviewed.   ED Course  Procedures (including critical care time) DIAGNOSTIC STUDIES: Oxygen Saturation is 100% on RA, normal by my interpretation.    COORDINATION OF CARE: 1:08 PM-Discussed treatment plan which includes lipase, CMP, CBC, and urinalysis with pt at bedside and pt agreed to plan.   Labs Review Labs Reviewed  COMPREHENSIVE METABOLIC PANEL - Abnormal; Notable for the following:    Glucose, Bld 127 (*)    ALT 67 (*)    All other components within normal limits  LIPASE, BLOOD  CBC  URINALYSIS, ROUTINE W REFLEX MICROSCOPIC (NOT AT [facility])  TROPONIN I  DIFFERENTIAL    Imaging Review Dg Chest 2 View  09/15/2015  CLINICAL DATA:  Intermittent chest pain shortness of  breath EXAM: CHEST  2 VIEW COMPARISON:  August 01, 2010 FINDINGS: Lungs are clear. Heart size and pulmonary vascularity are normal. No adenopathy. No pneumothorax. No bone lesions. IMPRESSION: No edema or consolidation. Electronically Signed   By: Bretta BangWilliam  Woodruff III M.D.   On: 09/15/2015 14:30   Ct Abdomen Pelvis W Contrast  09/15/2015  CLINICAL DATA:  Left upper quadrant pay for several weeks with worsening last 2 days EXAM: CT ABDOMEN AND  PELVIS WITH CONTRAST TECHNIQUE: Multidetector CT imaging of the abdomen and pelvis was performed using the standard protocol following bolus administration of intravenous contrast. CONTRAST:  125mL OMNIPAQUE IOHEXOL 300 MG/ML  SOLN COMPARISON:  05/03/2011 FINDINGS: Lower chest:  Lung bases are unremarkable.  Tiny hiatal hernia. Hepatobiliary: Again noted fatty infiltration of the liver. No focal mass. No calcified gallstones are noted within gallbladder. No intrahepatic biliary ductal dilatation. Pancreas: Enhanced pancreas is unremarkable. Spleen: Enhanced spleen is unremarkable. Adrenals/Urinary Tract: No adrenal gland mass. Enhanced kidneys are symmetrical in size. No hydronephrosis or hydroureter. The urinary bladder is unremarkable. Bilateral distal ureter is unremarkable. Stomach/Bowel: There is no gastric outlet obstruction. No small bowel obstruction. No thickened or dilated small bowel loops. Moderate stool noted in right colon. No pericecal inflammation. Normal appendix is noted in axial image 75. The terminal ileum is unremarkable. Moderate stool noted  in transverse colon and descending colon. No evidence of colitis or diverticulitis. Moderate colonic gas noted in proximal sigmoid colon. Moderate stool noted in rectum. There is no distal colonic obstruction. Vascular/Lymphatic: No retroperitoneal or mesenteric adenopathy. No aortic aneurysm. Reproductive: Prostate gland and seminal vesicles are unremarkable. Other: Tiny umbilical hernia containing fat without evidence of acute complication. There is no abdominal ascites or free air. Musculoskeletal: No destructive bony lesions are noted. Sagittal images of the spine shows mild degenerative changes lower lumbar spine. Minimal disc bulge with posterior spurring at L3-L4-L4-L5 and L5-S1 level. IMPRESSION: 1. There is no evidence of acute inflammatory process within abdomen or pelvis. 2. No pericecal inflammation. Normal appendix. Moderate stool noted in  right colon transverse colon and descending colon. Moderate gas noted in proximal sigmoid colon. Moderate stool noted within rectum. 3. No hydronephrosis or hydroureter. 4. Again noted fatty infiltration of the liver. 5. Mild degenerative changes lumbar spine. 6. No small bowel obstruction. Electronically Signed   By: Natasha Mead M.D.   On: 09/15/2015 14:31   I have personally reviewed and evaluated these images and lab results as part of my medical decision-making.   EKG Interpretation   Date/Time:  Monday September 15 2015 13:03:05 EDT Ventricular Rate:  65 PR Interval:  165 QRS Duration: 111 QT Interval:  384 QTC Calculation: 399 R Axis:   68 Text Interpretation:  Sinus rhythm No significant change was found  Confirmed by Manus Gunning  MD, STEPHEN [phone]) on 09/15/2015 1:10:26 PM      MDM   Final diagnoses:  Constipation, unspecified constipation type   Pt is well appearing.  Vitals stable.  Diffuse,left abdominal pain.  No concerning sx's for surgical abdomen.  CT of abd/pelvis shows moderate stool.  Results discussed with patient, he agrees to Miralax, increase water and fiber intake. Close PMD f/u , return precautions given.  He appears stable for d/c   I personally performed the services described in this documentation, which was scribed in my presence. The recorded information has been reviewed and is accurate.    Pauline Aus, PA-C 09/17/15 1712  Glynn Octave, MD 09/18/15 [phone]

## 2015-09-15 NOTE — ED Notes (Signed)
Pt c/o luq abd pain x 2 days. Pt reports some loose stools.

## 2015-09-15 NOTE — Discharge Instructions (Signed)
Constipation, Adult °Constipation is when a person: °· Poops (has a bowel movement) less than 3 times a week. °· Has a hard time pooping. °· Has poop that is dry, hard, or bigger than normal. °HOME CARE  °· Eat foods with a lot of fiber in them. This includes fruits, vegetables, beans, and whole grains such as brown rice. °· Avoid fatty foods and foods with a lot of sugar. This includes french fries, hamburgers, cookies, candy, and soda. °· If you are not getting enough fiber from food, take products with added fiber in them (supplements). °· Drink enough fluid to keep your pee (urine) clear or pale yellow. °· Exercise on a regular basis, or as told by your doctor. °· Go to the restroom when you feel like you need to poop. Do not hold it. °· Only take medicine as told by your doctor. Do not take medicines that help you poop (laxatives) without talking to your doctor first. °GET HELP RIGHT AWAY IF:  °· You have bright red blood in your poop (stool). °· Your constipation lasts more than 4 days or gets worse. °· You have belly (abdominal) or butt (rectal) pain. °· You have thin poop (as thin as a pencil). °· You lose weight, and it cannot be explained. °MAKE SURE YOU:  °· Understand these instructions. °· Will watch your condition. °· Will get help right away if you are not doing well or get worse. °  °This information is not intended to replace advice given to you by your health care provider. Make sure you discuss any questions you have with your health care provider. °  °Document Released: 11/17/2007 Document Revised: 06/21/2014 Document Reviewed: 03/12/2013 °Elsevier Interactive Patient Education ©2016 Elsevier Inc. ° °High-Fiber Diet °Fiber, also called dietary fiber, is a type of carbohydrate found in fruits, vegetables, whole grains, and beans. A high-fiber diet can have many health benefits. Your health care provider may recommend a high-fiber diet to help: °· Prevent constipation. Fiber can make your bowel  movements more regular. °· Lower your cholesterol. °· Relieve hemorrhoids, uncomplicated diverticulosis, or irritable bowel syndrome. °· Prevent overeating as part of a weight-loss plan. °· Prevent heart disease, type 2 diabetes, and certain cancers. °WHAT IS MY PLAN? °The recommended daily intake of fiber includes: °· 38 grams for men under age 50. °· 30 grams for men over age 50. °· 25 grams for women under age 50. °· 21 grams for women over age 50. °You can get the recommended daily intake of dietary fiber by eating a variety of fruits, vegetables, grains, and beans. Your health care provider may also recommend a fiber supplement if it is not possible to get enough fiber through your diet. °WHAT DO I NEED TO KNOW ABOUT A HIGH-FIBER DIET? °· Fiber supplements have not been widely studied for their effectiveness, so it is better to get fiber through food sources. °· Always check the fiber content on the nutrition facts label of any prepackaged food. Look for foods that contain at least 5 grams of fiber per serving. °· Ask your dietitian if you have questions about specific foods that are related to your condition, especially if those foods are not listed in the following section. °· Increase your daily fiber consumption gradually. Increasing your intake of dietary fiber too quickly may cause bloating, cramping, or gas. °· Drink plenty of water. Water helps you to digest fiber. °WHAT FOODS CAN I EAT? °Grains °Whole-grain breads. Multigrain cereal. Oats and oatmeal. Brown rice. Barley.   Bulgur wheat. Millet. Bran muffins. Popcorn. Rye wafer crackers. °Vegetables °Sweet potatoes. Spinach. Kale. Artichokes. Cabbage. Broccoli. Green peas. Carrots. Squash. °Fruits °Berries. Pears. Apples. Oranges. Avocados. Prunes and raisins. Dried figs. °Meats and Other Protein Sources °Navy, kidney, pinto, and soy beans. Split peas. Lentils. Nuts and seeds. °Dairy °Fiber-fortified yogurt. °Beverages °Fiber-fortified soy milk.  Fiber-fortified orange juice. °Other °Fiber bars. °The items listed above may not be a complete list of recommended foods or beverages. Contact your dietitian for more options. °WHAT FOODS ARE NOT RECOMMENDED? °Grains °White bread. Pasta made with refined flour. White rice. °Vegetables °Fried potatoes. Canned vegetables. Well-cooked vegetables.  °Fruits °Fruit juice. Cooked, strained fruit. °Meats and Other Protein Sources °Fatty cuts of meat. Fried poultry or fried fish. °Dairy °Milk. Yogurt. Cream cheese. Sour cream. °Beverages °Soft drinks. °Other °Cakes and pastries. Butter and oils. °The items listed above may not be a complete list of foods and beverages to avoid. Contact your dietitian for more information. °WHAT ARE SOME TIPS FOR INCLUDING HIGH-FIBER FOODS IN MY DIET? °· Eat a wide variety of high-fiber foods. °· Make sure that half of all grains consumed each day are whole grains. °· Replace breads and cereals made from refined flour or white flour with whole-grain breads and cereals. °· Replace white rice with brown rice, bulgur wheat, or millet. °· Start the day with a breakfast that is high in fiber, such as a cereal that contains at least 5 grams of fiber per serving. °· Use beans in place of meat in soups, salads, or pasta. °· Eat high-fiber snacks, such as berries, raw vegetables, nuts, or popcorn. °  °This information is not intended to replace advice given to you by your health care provider. Make sure you discuss any questions you have with your health care provider. °  °Document Released: 05/31/2005 Document Revised: 06/21/2014 Document Reviewed: 11/13/2013 °Elsevier Interactive Patient Education ©2016 Elsevier Inc. ° °

## 2015-10-20 HISTORY — PX: ESOPHAGOGASTRODUODENOSCOPY: SHX1529

## 2016-01-07 ENCOUNTER — Encounter: Payer: Self-pay | Admitting: Nurse Practitioner

## 2016-01-07 ENCOUNTER — Ambulatory Visit (INDEPENDENT_AMBULATORY_CARE_PROVIDER_SITE_OTHER): Payer: BC Managed Care – PPO | Admitting: Nurse Practitioner

## 2016-01-07 VITALS — BP 143/86 | HR 77 | Temp 97.9°F | Ht 76.0 in | Wt 363.2 lb

## 2016-01-07 DIAGNOSIS — K219 Gastro-esophageal reflux disease without esophagitis: Secondary | ICD-10-CM | POA: Diagnosis not present

## 2016-01-07 DIAGNOSIS — R1012 Left upper quadrant pain: Secondary | ICD-10-CM

## 2016-01-07 DIAGNOSIS — K2970 Gastritis, unspecified, without bleeding: Secondary | ICD-10-CM | POA: Insufficient documentation

## 2016-01-07 DIAGNOSIS — K2990 Gastroduodenitis, unspecified, without bleeding: Secondary | ICD-10-CM | POA: Diagnosis not present

## 2016-01-07 MED ORDER — LIDOCAINE VISCOUS 2 % MT SOLN: 15.0000 mL | Freq: Four times a day (QID) | OROMUCOSAL | 1 refills | Status: AC | PRN

## 2016-01-07 NOTE — Patient Instructions (Signed)
1. Stop taking Protonix. 2. Start taking Dexon 60 mg once a day. Take this 30 minutes before your first meal the day. 3. I sent and viscous lidocaine solution to your pharmacy. If he have breakthrough acid reflux/heartburn symptoms you can take 15 mL of viscous lidocaine to swallow. 4. If you would like, we can provide you with samples of FD guard as well. 5. Return for follow-up in 4-6 weeks.

## 2016-01-07 NOTE — Progress Notes (Signed)
Primary Care Physician:  Etta Quill, PA-C Primary Gastroenterologist:  Dr. Jena Gauss  Chief Complaint  Patient presents with  . Abdominal Pain    more on left side  . Nausea    HPI:   Alejandro Kennedy is a 35 y.o. male who presents For evaluation of left-sided abdominal pain and nausea. Last seen in our office 05/20/2011 for epigastric pain, left upper quadrant pain, elevated LFTs. At that time it was noted mild elevation of ALT at 65 with prior history of AST/ALT mild elevation and normal lipase, abdominal ultrasound November 2012 diffuse fatty infiltration. Was taking ibuprofen about twice a week at that time. He was on a PPI at that time and arranged for upper endoscopy which was completed on 06/16/2011. Findings included mild gastritis. Recommended Protonix daily, await biopsies, avoid daily for one month, hydrogen breath test if biopsy negative. Biopsies were negative and he was set up for a hydrogen breath test for lactose intolerance, avoid NSAIDs. HBT found no evidence for lactose intolerance. He was a no-show for his follow-up appointment and subsequently lost to follow-up.  Today he states in February he began having LUQ pain, progressivey worse, pain was shooting up in his chest and down lower. Went to the ER: EGG normal. Abdominal CT: constipated. Gave him Miralax, which didn't help. Went to PCP [place]) who referred to GI in [place]. Was given Linzess which "cleared the blockage." Having intestinal spasms. Will be laying flat and can see "spasming of my intestines" through his anterior abdominal wall. Had an EGD, said there was some inflammation. Was given dicyclomine. No further follow-up with GI. Here for more help. When he has "bad spasms" with worsening reflux. Denies hematochezia. Noted a couple dark stools, sometimes loose "not sure" if 'black/sticky.'  Has some nausea when he has an episode, no vomiting. Some regurgitation. Also nauseated after eating/drinking.  States he  consulted with GI specialist in [place] through his wife's mom who's a nurse in [place] who recommended stop all medications and start FD Delene Ruffini, which he thinks helped.  "Was told after the EGD to take the medicine and follow-up in 2 months and call if any problems. He did call and was told they'd see him on follow-up and to take Protonix bid until then." Has cancelled his appointment. Tried bid dosing with no improvement and dropped back to once a day.   Patient stated he was under the impression he was seeing Dr. Jena Gauss as per his request. Explained why he was seeing me today and was ok with this but would like to see Dr. Jena Gauss  Past Medical History:  Diagnosis Date  . Generalized headaches   . Hepatic steatosis   . Sleep apnea   . Ulcer    per pt was told he had an ulcer, no prior EGD    Past Surgical History:  Procedure Laterality Date  . cyst removed  2004   from throat  . ESOPHAGOGASTRODUODENOSCOPY  06/16/2011   Procedure: ESOPHAGOGASTRODUODENOSCOPY (EGD);  Surgeon: Arlyce Harman, MD;  [facility];  Service: Endoscopy;  Laterality: N/A;  10:30  . HYDROGEN BREATH TEST  07/14/2011   Procedure: HYDROGEN BREATH TEST;  Surgeon: Arlyce Harman, MD;  [facility];  Service: Endoscopy;  Laterality: N/A;  EVALUATE FOR LACTOSE INTOLERANCE     Current Outpatient Prescriptions  Medication Sig Dispense Refill  . Cholecalciferol (VITAMIN D-1000 MAX ST) 1000 units tablet Take 1,000 Units by mouth daily.     Marland Kitchen  dicyclomine (BENTYL) 20 MG tablet Take 10 mg by mouth 4 (four) times daily -  before meals and at bedtime.    . Omega-3 Fatty Acids (FISH OIL) 1000 MG CAPS Take 1 capsule by mouth 2 (two) times daily.    . ondansetron (ZOFRAN) 4 MG tablet Take 1 tablet (4 mg total) by mouth every 6 (six) hours. 8 tablet 0  . pantoprazole (PROTONIX) 40 MG tablet Take 1 tablet (40 mg total) by mouth daily. 30 tablet 6   No current facility-administered medications for this visit.      Allergies as of 01/07/2016  . (No Known Allergies)    Family History  Problem Relation Age of Onset  . Diabetes Mother   . Hypertension Mother   . Diabetes Father   . Heart disease    . Kidney disease    . Arthritis    . Colon cancer Neg Hx     Social History   Social History  . Marital status: Married    Spouse name: N/A  . Number of children: N/A  . Years of education: N/A   Occupational History  . [employer]   Social History Main Topics  . Smoking status: Never Smoker  . Smokeless tobacco: Never Used  . Alcohol use Yes     Comment: rarely  . Drug use: No  . Sexual activity: Not on file   Other Topics Concern  . Not on file   Social History Narrative  . No narrative on file    Review of Systems: General: Negative for anorexia, weight loss, fever, chills, fatigue, weakness. Eyes: Negative for vision changes.  ENT: Negative for hoarseness, difficulty swallowing , nasal congestion. CV: Negative for chest pain, angina, palpitations, dyspnea on exertion, peripheral edema.  Respiratory: Negative for dyspnea at rest, dyspnea on exertion, cough, sputum, wheezing.  GI: See history of present illness. GU:  Negative for dysuria, hematuria, urinary incontinence, urinary frequency, nocturnal urination.  MS: Negative for joint pain, low back pain.  Derm: Negative for rash or itching.  Neuro: Negative for weakness, abnormal sensation, seizure, frequent headaches, memory loss, confusion.  Psych: Negative for anxiety, depression, suicidal ideation, hallucinations.  Endo: Negative for unusual weight change.  Heme: Negative for bruising or bleeding. Allergy: Negative for rash or hives.    Physical Exam: BP (!) 143/86   Pulse 77   Temp 97.9 F (36.6 C) (Oral)   Ht  (1.93 m)   Wt (!) 363 lb 3.2 oz (164.7 kg)   BMI 44.21 kg/m  General:   Morbidly obese male. Alert and oriented. Pleasant and cooperative. Well-nourished and  well-developed.  Head:  Normocephalic and atraumatic. Eyes:  Without icterus, sclera clear and conjunctiva pink.  Ears:  Normal auditory acuity. Cardiovascular:  S1, S2 present without murmurs appreciated. Extremities without clubbing or edema. Respiratory:  Clear to auscultation bilaterally. No wheezes, rales, or rhonchi. No distress.  Gastrointestinal:  +BS, soft,and non-distended. Noted epigastric TTP. No HSM noted. No guarding or rebound. No masses appreciated.  Rectal:  Deferred  Musculoskalatal:  Symmetrical without gross deformities. Neurologic:  Alert and oriented x4;  grossly normal neurologically. Psych:  Alert and cooperative. Normal mood and affect. Heme/Lymph/Immune: No excessive bruising noted.    01/07/2016 9:22 AM   Disclaimer: This note was dictated with voice recognition software. Similar sounding words can inadvertently be transcribed and may not be corrected upon review.

## 2016-01-08 NOTE — Assessment & Plan Note (Signed)
As per addendum, received upper endoscopy report which noted gastritis with noted heme in the stomach. Per procedure note biopsy was taken and showed compatible with gastritis, no mention of H. pylori. Recommendations as per above, return for follow-up 4-6 weeks.

## 2016-01-08 NOTE — Progress Notes (Signed)
Asked office visit note and EGD report received, marked for scanning into the system.

## 2016-01-08 NOTE — Assessment & Plan Note (Signed)
Patient was previously seen [place] for GERD who had a worsening left upper quadrant pain was referred to GI [facility]. He had an upper endoscopy at their office, and we have requested those records. Was given dicyclomine for intestinal spasms. He was started on Protonix once a day and was given a 2 month follow-up visit. He called after 1 month of Protonix saying he was still having symptoms, they recommended increasing the twice a day and follow-up in 3 weeks at his regularly scheduled appointment. He was not happy with this and canceled his appointment.  At this time I have him stop Protonix, start Dexon 60 mg daily. I will send viscous lidocaine to his pharmacy to take 15 mL every 6-8 hours as needed for a limited trial. We'll offer him at the [facility] if he would like it. Return for follow-up in 4-6 weeks to further evaluate.

## 2016-01-08 NOTE — Progress Notes (Signed)
CC'ED TO PCP 

## 2016-01-08 NOTE — Assessment & Plan Note (Signed)
Left upper quadrant pain likely flare, uncontrolled GERD/gastritis. Changed from Protonix to Dexilant as noted above as well as viscous lidocaine as needed. Return for follow-up in 4-6 weeks.

## 2016-02-20 ENCOUNTER — Encounter: Payer: Self-pay | Admitting: Internal Medicine

## 2016-02-20 ENCOUNTER — Other Ambulatory Visit: Payer: Self-pay

## 2016-02-20 ENCOUNTER — Ambulatory Visit (INDEPENDENT_AMBULATORY_CARE_PROVIDER_SITE_OTHER): Payer: BC Managed Care – PPO | Admitting: Internal Medicine

## 2016-02-20 VITALS — BP 157/84 | HR 83 | Temp 98.3°F | Ht 76.0 in | Wt 363.0 lb

## 2016-02-20 DIAGNOSIS — R1012 Left upper quadrant pain: Secondary | ICD-10-CM

## 2016-02-20 DIAGNOSIS — R195 Other fecal abnormalities: Secondary | ICD-10-CM | POA: Diagnosis not present

## 2016-02-20 DIAGNOSIS — K219 Gastro-esophageal reflux disease without esophagitis: Secondary | ICD-10-CM | POA: Diagnosis not present

## 2016-02-20 MED ORDER — PEG-KCL-NACL-NASULF-NA ASC-C 100 G PO SOLR: 1.0000 | ORAL | 0 refills | Status: AC

## 2016-02-20 NOTE — Progress Notes (Addendum)
Primary Care Physician:  Etta QuillBRYAN,DANIEL J, PA-C Primary Gastroenterologist:  Dr. Jena Gaussourk  Pre-Procedure History & Physical: HPI:  Alejandro Kennedy is a 35 y.o. male here for to follow-up of bothersome left upper quadrant abdominal pain of about 6 months duration. Was intermittent now nearly unrelenting. Not clearly affected by meal or bowel movement. Has intermittent breakthrough reflux symptoms on Protonix 40 mg daily. 2 week course of Dexilant made no difference in symptoms. Hasn't had any nausea or vomiting. No melena,  rectal bleeding; his stools have been dark on occasion. CT scan demonstrated quite a bit of stool burden in the colon-patient recalls having what he thought was a regular bowel movement just before the CT scan. Hydrogen breath test negative EGD. No right-sided pain or any history of gallbladder issues. Labs okay. He gained 40 pounds in the past 3 years. Fatty liver on CT.  Does not wear CPAP although he has it at home. 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  Exacerbations of left upper quadrant abdominal pain -  may last 15-20 minutes.Can be incapacitating. No temporal relationship with a bowel movement as stated. Wife also notes he has a "metallic" odor when he has attack and then it rapidly subsides.  Past Medical History:  Diagnosis Date  . Generalized headaches   . Hepatic steatosis   . Sleep apnea   . Ulcer    per pt was told he had an ulcer, no prior EGD    Past Surgical History:  Procedure Laterality Date  . cyst removed  2004   from throat  . ESOPHAGOGASTRODUODENOSCOPY  06/16/2011   Dr.Fields- mild gastritis, bx= mild chronic inflammation  . ESOPHAGOGASTRODUODENOSCOPY  10/20/2015   Dr. Joslyn HyAkdamar- normal esophagus, normal duodenum, erythema and heme in the antrum and stomach body compatible with gastritis  . HYDROGEN BREATH TEST  07/14/2011   Procedure: HYDROGEN BREATH TEST;  Surgeon: Arlyce HarmanSandi M Fields, MD;  Location: [facility];  Service: Endoscopy;   Laterality: N/A;  EVALUATE FOR LACTOSE INTOLERANCE     Prior to Admission medications   Medication Sig Start Date End Date Taking? Authorizing Provider  Cholecalciferol (VITAMIN D-1000 MAX ST) 1000 units tablet Take 1,000 Units by mouth daily.    Yes Historical Provider, MD  lidocaine (XYLOCAINE) 2 % solution Use as directed 15 mLs in the mouth or throat every 6 (six) hours as needed (worsening reflux). 01/07/16  Yes Anice PaganiniEric A Gill, NP  Omega-3 Fatty Acids (FISH OIL) 1000 MG CAPS Take 1 capsule by mouth 2 (two) times daily.   Yes Historical Provider, MD  pantoprazole (PROTONIX) 40 MG tablet Take 1 tablet (40 mg total) by mouth daily. 06/04/13  Yes Salley ScarletKawanta F Troy, MD  dicyclomine (BENTYL) 20 MG tablet Take 10 mg by mouth 4 (four) times daily -  before meals and at bedtime.    Historical Provider, MD  ondansetron (ZOFRAN) 4 MG tablet Take 1 tablet (4 mg total) by mouth every 6 (six) hours. Patient not taking: Reported on 02/20/2016 09/15/15   Tammy Triplett, PA-C    Allergies as of 02/20/2016  . (No Known Allergies)    Family History  Problem Relation Age of Onset  . Diabetes Mother   . Hypertension Mother   . Diabetes Father   . Heart disease    . Kidney disease    . Arthritis    . Colon cancer Neg Hx     Social History   Social History  . Marital status: Married  Spouse name: N/A  . Number of children: N/A  . Years of education: N/A   Occupational History  . [employer]   Social History Main Topics  . Smoking status: Never Smoker  . Smokeless tobacco: Never Used  . Alcohol use Yes     Comment: rarely  . Drug use: No  . Sexual activity: Not on file   Other Topics Concern  . Not on file   Social History Narrative  . No narrative on file    Review of Systems: See HPI, otherwise negative ROS  Physical Exam: BP (!) 157/84   Pulse 83   Temp 98.3 F (36.8 C) (Oral)   Ht 6\' 4"  (1.93 m)   Wt (!) 363 lb (164.7 kg)   BMI 44.19  kg/m  General:   Alert,   pleasant and cooperative in NAD Skin:  Intact without significant lesions or rashes. Lungs:  Clear throughout to auscultation.   No wheezes, crackles, or rhonchi. No acute distress. Heart:  Regular rate and rhythm; no murmurs, clicks, rubs,  or gallops. Abdomen: obese.Non-distended, normal bowel sounds.  Soft and nontender without appreciable mass or hepatosplenomegaly.  Pulses:  Normal pulses noted. Extremities:  Without clubbing or edema. Rectal: Good sphincter tone no mass rectal vault prostate palpated to be normal. Scant brown stool. Hemoccult positive.   Impression:  35 year old morbidly obese gentleman with a GERD fairly well-controlled 6 month history of left upper quadrant abdominal pain. Multiple studies negative. Stool burden in colon noted may or may not be related to pain. He is Hemoccult positive. He needs to have a colonoscopy although told him we might not find a well-defined calls of abdominal pain with this procedure but it needs to be done.  The risks, benefits, limitations, alternatives and imponderables have been reviewed with the patient. Questions have been answered. All parties are agreeable.   Recommendations:   Schedule a colonoscopy - LUQ abdominal pain / hemocult positive stool  Increase Protonix to 40 mg twice daily  Sleep apnea needs to be treated effectively  Weight loss recommended  Further recommendations to follow   Notice: This dictation was prepared with Dragon dictation along with smaller phrase technology. Any transcriptional errors that result from this process are unintentional and may not be corrected upon review.

## 2016-02-20 NOTE — Patient Instructions (Signed)
Schedule a colonoscopy - LUQ abdominal pain / hemocult positive stool  Increase Protonix to 40 mg twice daily  Sleep apnea needs to be treated effectively  Weight loss recommended  Further recommendations to follow

## 2016-02-25 ENCOUNTER — Encounter (HOSPITAL_COMMUNITY): Payer: Self-pay | Admitting: *Deleted

## 2016-02-25 ENCOUNTER — Encounter (HOSPITAL_COMMUNITY)
Admission: RE | Disposition: A | Discharge status: Home / Self Care | Payer: Self-pay | Source: Ambulatory Visit | Attending: Internal Medicine

## 2016-02-25 ENCOUNTER — Ambulatory Visit (HOSPITAL_COMMUNITY)
Admission: RE | Admit: 2016-02-25 | Discharge: 2016-02-25 | Disposition: A | Discharge status: Home / Self Care | Payer: BC Managed Care – PPO | Source: Ambulatory Visit | Attending: Internal Medicine | Admitting: Internal Medicine

## 2016-02-25 DIAGNOSIS — R1012 Left upper quadrant pain: Secondary | ICD-10-CM | POA: Insufficient documentation

## 2016-02-25 DIAGNOSIS — K219 Gastro-esophageal reflux disease without esophagitis: Secondary | ICD-10-CM | POA: Insufficient documentation

## 2016-02-25 DIAGNOSIS — G4730 Sleep apnea, unspecified: Secondary | ICD-10-CM | POA: Insufficient documentation

## 2016-02-25 DIAGNOSIS — K641 Second degree hemorrhoids: Secondary | ICD-10-CM | POA: Insufficient documentation

## 2016-02-25 DIAGNOSIS — Z79899 Other long term (current) drug therapy: Secondary | ICD-10-CM | POA: Insufficient documentation

## 2016-02-25 DIAGNOSIS — Z6841 Body Mass Index (BMI) 40.0 and over, adult: Secondary | ICD-10-CM | POA: Diagnosis not present

## 2016-02-25 DIAGNOSIS — R195 Other fecal abnormalities: Secondary | ICD-10-CM | POA: Diagnosis not present

## 2016-02-25 HISTORY — PX: COLONOSCOPY: SHX5424

## 2016-02-25 SURGERY — COLONOSCOPY
Anesthesia: Moderate Sedation

## 2016-02-25 MED ORDER — ONDANSETRON HCL 4 MG/2ML IJ SOLN
INTRAMUSCULAR | Status: AC
  Filled 2016-02-25: qty 2

## 2016-02-25 MED ORDER — ONDANSETRON HCL 4 MG/2ML IJ SOLN
INTRAMUSCULAR | Status: DC | PRN
  Administered 2016-02-25: 4 mg via INTRAVENOUS

## 2016-02-25 MED ORDER — SODIUM CHLORIDE 0.9 % IV SOLN
INTRAVENOUS | Status: DC
  Administered 2016-02-25: 13:00:00 via INTRAVENOUS

## 2016-02-25 MED ORDER — MIDAZOLAM HCL 5 MG/5ML IJ SOLN
INTRAMUSCULAR | Status: AC
  Filled 2016-02-25: qty 10

## 2016-02-25 MED ORDER — MEPERIDINE HCL 100 MG/ML IJ SOLN
INTRAMUSCULAR | Status: AC
  Filled 2016-02-25: qty 2

## 2016-02-25 MED ORDER — MIDAZOLAM HCL 5 MG/5ML IJ SOLN
INTRAMUSCULAR | Status: DC | PRN
  Administered 2016-02-25 (×2): 1 mg via INTRAVENOUS
  Administered 2016-02-25: 2 mg via INTRAVENOUS
  Administered 2016-02-25: 1 mg via INTRAVENOUS
  Administered 2016-02-25: 2 mg via INTRAVENOUS

## 2016-02-25 MED ORDER — MEPERIDINE HCL 100 MG/ML IJ SOLN
INTRAMUSCULAR | Status: DC | PRN
  Administered 2016-02-25: 50 mg via INTRAVENOUS
  Administered 2016-02-25 (×2): 25 mg via INTRAVENOUS

## 2016-02-25 NOTE — Progress Notes (Signed)
Please excuse from work on 02/25/2016. He may return to work on Friday 02-27-2016.

## 2016-02-25 NOTE — Discharge Instructions (Addendum)
Colonoscopy Discharge Instructions  Read the instructions outlined below and refer to this sheet in the next few weeks. These discharge instructions provide you with general information on caring for yourself after you leave the hospital. Your doctor may also give you specific instructions. While your treatment has been planned according to the most current medical practices available, unavoidable complications occasionally occur. If you have any problems or questions after discharge, call Dr. Jena Gaussourk at [phone]. ACTIVITY  You may resume your regular activity, but move at a slower pace for the next 24 hours.   Take frequent rest periods for the next 24 hours.   Walking will help get rid of the air and reduce the bloated feeling in your belly (abdomen).   No driving for 24 hours (because of the medicine (anesthesia) used during the test).    Do not sign any important legal documents or operate any machinery for 24 hours (because of the anesthesia used during the test).  NUTRITION  Drink plenty of fluids.   You may resume your normal diet as instructed by your doctor.   Begin with a light meal and progress to your normal diet. Heavy or fried foods are harder to digest and may make you feel sick to your stomach (nauseated).   Avoid alcoholic beverages for 24 hours or as instructed.  MEDICATIONS  You may resume your normal medications unless your doctor tells you otherwise.  WHAT YOU CAN EXPECT TODAY  Some feelings of bloating in the abdomen.   Passage of more gas than usual.   Spotting of blood in your stool or on the toilet paper.  IF YOU HAD POLYPS REMOVED DURING THE COLONOSCOPY:  No aspirin products for 7 days or as instructed.   No alcohol for 7 days or as instructed.   Eat a soft diet for the next 24 hours.  FINDING OUT THE RESULTS OF YOUR TEST Not all test results are available during your visit. If your test results are not back during the visit, make an appointment  with your caregiver to find out the results. Do not assume everything is normal if you have not heard from your caregiver or the medical facility. It is important for you to follow up on all of your test results.  SEEK IMMEDIATE MEDICAL ATTENTION IF:  You have more than a spotting of blood in your stool.   Your belly is swollen (abdominal distention).   You are nauseated or vomiting.   You have a temperature over 101.   You have abdominal pain or discomfort that is severe or gets worse throughout the day.   Hemorrhoid information provided  Continue protonix 40mg  daily.Marland Kitchen.Marland Kitchen.Marland Kitchen.Increase to twice daily  Office visit in 4- 6 weeks   APPOINTMENT:  LESLIE LEWIS, October 11,2017 AT 10:30      Hemorrhoids Hemorrhoids are swollen veins around the rectum or anus. There are two types of hemorrhoids:   Internal hemorrhoids. These occur in the veins just inside the rectum. They may poke through to the outside and become irritated and painful.  External hemorrhoids. These occur in the veins outside the anus and can be felt as a painful swelling or hard lump near the anus. CAUSES  Pregnancy.   Obesity.   Constipation or diarrhea.   Straining to have a bowel movement.   Sitting for long periods on the toilet.  Heavy lifting or other activity that caused you to strain.  Anal intercourse. SYMPTOMS   Pain.   Anal itching or irritation.  Rectal bleeding.   Fecal leakage.   Anal swelling.   One or more lumps around the anus.  DIAGNOSIS  Your caregiver may be able to diagnose hemorrhoids by visual examination. Other examinations or tests that may be performed include:   Examination of the rectal area with a gloved hand (digital rectal exam).   Examination of anal canal using a small tube (scope).   A blood test if you have lost a significant amount of blood.  A test to look inside the colon (sigmoidoscopy or colonoscopy). TREATMENT Most hemorrhoids can be treated  at home. However, if symptoms do not seem to be getting better or if you have a lot of rectal bleeding, your caregiver may perform a procedure to help make the hemorrhoids get smaller or remove them completely. Possible treatments include:   Placing a rubber band at the base of the hemorrhoid to cut off the circulation (rubber band ligation).   Injecting a chemical to shrink the hemorrhoid (sclerotherapy).   Using a tool to burn the hemorrhoid (infrared light therapy).   Surgically removing the hemorrhoid (hemorrhoidectomy).   Stapling the hemorrhoid to block blood flow to the tissue (hemorrhoid stapling).  HOME CARE INSTRUCTIONS   Eat foods with fiber, such as whole grains, beans, nuts, fruits, and vegetables. Ask your doctor about taking products with added fiber in them (fibersupplements).  Increase fluid intake. Drink enough water and fluids to keep your urine clear or pale yellow.   Exercise regularly.   Go to the bathroom when you have the urge to have a bowel movement. Do not wait.   Avoid straining to have bowel movements.   Keep the anal area dry and clean. Use wet toilet paper or moist towelettes after a bowel movement.   Medicated creams and suppositories may be used or applied as directed.   Only take over-the-counter or prescription medicines as directed by your caregiver.   Take warm sitz baths for 15-20 minutes, 3-4 times a day to ease pain and discomfort.   Place ice packs on the hemorrhoids if they are tender and swollen. Using ice packs between sitz baths may be helpful.   Put ice in a plastic bag.   Place a towel between your skin and the bag.   Leave the ice on for 15-20 minutes, 3-4 times a day.   Do not use a donut-shaped pillow or sit on the toilet for long periods. This increases blood pooling and pain.  SEEK MEDICAL CARE IF:  You have increasing pain and swelling that is not controlled by treatment or medicine.  You have  uncontrolled bleeding.  You have difficulty or you are unable to have a bowel movement.  You have pain or inflammation outside the area of the hemorrhoids. MAKE SURE YOU:  Understand these instructions.  Will watch your condition.  Will get help right away if you are not doing well or get worse.   This information is not intended to replace advice given to you by your health care provider. Make sure you discuss any questions you have with your health care provider.   Document Released: 05/28/2000 Document Revised: 05/17/2012 Document Reviewed: 04/04/2012 Elsevier Interactive Patient Education [employer]

## 2016-02-25 NOTE — Interval H&P Note (Deleted)
History and Physical Interval Note:  02/25/2016 1:44 PM  Alejandro Kennedy  has presented today for surgery, with the diagnosis of LUQ abd pain, hemocult positive stool  The various methods of treatment have been discussed with the patient and family. After consideration of risks, benefits and other options for treatment, the patient has consented to  Procedure(s) with comments: COLONOSCOPY (N/A) - 1:45 PM as a surgical intervention .  The patient's history has been reviewed, patient examined, no change in status, stable for surgery.  I have reviewed the patient's chart and labs.  Questions were answered to the patient's satisfaction.   No change.  Diagnostic colonoscopy per plan.  The risks, benefits, limitations, alternatives and imponderables have been reviewed with the patient. Questions have been answered. All parties are agreeable.   Alejandro Kennedy

## 2016-02-25 NOTE — H&P (View-Only) (Signed)
  Primary Care Physician:  BRYAN,DANIEL J, PA-C Primary Gastroenterologist:  Dr. Gumecindo Hopkin  Pre-Procedure History & Physical: HPI:  Alejandro Kennedy is a 35 y.o. male here for to follow-up of bothersome left upper quadrant abdominal pain of about 6 months duration. Was intermittent now nearly unrelenting. Not clearly affected by meal or bowel movement. Has intermittent breakthrough reflux symptoms on Protonix 40 mg daily. 2 week course of Dexilant made no difference in symptoms. Hasn't had any nausea or vomiting. No melena,  rectal bleeding; his stools have been dark on occasion. CT scan demonstrated quite a bit of stool burden in the colon-patient recalls having what he thought was a regular bowel movement just before the CT scan. Hydrogen breath test negative EGD. No right-sided pain or any history of gallbladder issues. Labs okay. He gained 40 pounds in the past 3 years. Fatty liver on CT.  Does not wear CPAP although he has it at home. 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  Exacerbations of left upper quadrant abdominal pain -  may last 15-20 minutes.Can be incapacitating. No temporal relationship with a bowel movement as stated. Wife also notes he has a "metallic" odor when he has attack and then it rapidly subsides.  Past Medical History:  Diagnosis Date  . Generalized headaches   . Hepatic steatosis   . Sleep apnea   . Ulcer    per pt was told he had an ulcer, no prior EGD    Past Surgical History:  Procedure Laterality Date  . cyst removed  2004   from throat  . ESOPHAGOGASTRODUODENOSCOPY  06/16/2011   Dr.Fields- mild gastritis, bx= mild chronic inflammation  . ESOPHAGOGASTRODUODENOSCOPY  10/20/2015   Dr. Akdamar- normal esophagus, normal duodenum, erythema and heme in the antrum and stomach body compatible with gastritis  . HYDROGEN BREATH TEST  07/14/2011   Procedure: HYDROGEN BREATH TEST;  Surgeon: Sandi M Fields, MD;  Location: [facility];  Service: Endoscopy;   Laterality: N/A;  EVALUATE FOR LACTOSE INTOLERANCE     Prior to Admission medications   Medication Sig Start Date End Date Taking? Authorizing Provider  Cholecalciferol (VITAMIN D-1000 MAX ST) 1000 units tablet Take 1,000 Units by mouth daily.    Yes Historical Provider, MD  lidocaine (XYLOCAINE) 2 % solution Use as directed 15 mLs in the mouth or throat every 6 (six) hours as needed (worsening reflux). 01/07/16  Yes Eric A Gill, NP  Omega-3 Fatty Acids (FISH OIL) 1000 MG CAPS Take 1 capsule by mouth 2 (two) times daily.   Yes Historical Provider, MD  pantoprazole (PROTONIX) 40 MG tablet Take 1 tablet (40 mg total) by mouth daily. 06/04/13  Yes Kawanta F Jesup, MD  dicyclomine (BENTYL) 20 MG tablet Take 10 mg by mouth 4 (four) times daily -  before meals and at bedtime.    Historical Provider, MD  ondansetron (ZOFRAN) 4 MG tablet Take 1 tablet (4 mg total) by mouth every 6 (six) hours. Patient not taking: Reported on 02/20/2016 09/15/15   Tammy Triplett, PA-C    Allergies as of 02/20/2016  . (No Known Allergies)    Family History  Problem Relation Age of Onset  . Diabetes Mother   . Hypertension Mother   . Diabetes Father   . Heart disease    . Kidney disease    . Arthritis    . Colon cancer Neg Hx     Social History   Social History  . Marital status: Married      Spouse name: N/A  . Number of children: N/A  . Years of education: N/A   Occupational History  . [employer]   Social History Main Topics  . Smoking status: Never Smoker  . Smokeless tobacco: Never Used  . Alcohol use Yes     Comment: rarely  . Drug use: No  . Sexual activity: Not on file   Other Topics Concern  . Not on file   Social History Narrative  . No narrative on file    Review of Systems: See HPI, otherwise negative ROS  Physical Exam: BP (!) 157/84   Pulse 83   Temp 98.3 F (36.8 C) (Oral)   Ht 6' 4" (1.93 m)   Wt (!) 363 lb (164.7 kg)   BMI 44.19  kg/m  General:   Alert,   pleasant and cooperative in NAD Skin:  Intact without significant lesions or rashes. Lungs:  Clear throughout to auscultation.   No wheezes, crackles, or rhonchi. No acute distress. Heart:  Regular rate and rhythm; no murmurs, clicks, rubs,  or gallops. Abdomen: obese.Non-distended, normal bowel sounds.  Soft and nontender without appreciable mass or hepatosplenomegaly.  Pulses:  Normal pulses noted. Extremities:  Without clubbing or edema. Rectal: Good sphincter tone no mass rectal vault prostate palpated to be normal. Scant brown stool. Hemoccult positive.   Impression:  35-year-old morbidly obese gentleman with a GERD fairly well-controlled 6 month history of left upper quadrant abdominal pain. Multiple studies negative. Stool burden in colon noted may or may not be related to pain. He is Hemoccult positive. He needs to have a colonoscopy although told him we might not find a well-defined calls of abdominal pain with this procedure but it needs to be done.  The risks, benefits, limitations, alternatives and imponderables have been reviewed with the patient. Questions have been answered. All parties are agreeable.   Recommendations:   Schedule a colonoscopy - LUQ abdominal pain / hemocult positive stool  Increase Protonix to 40 mg twice daily  Sleep apnea needs to be treated effectively  Weight loss recommended  Further recommendations to follow   Notice: This dictation was prepared with Dragon dictation along with smaller phrase technology. Any transcriptional errors that result from this process are unintentional and may not be corrected upon review. 

## 2016-02-26 NOTE — Op Note (Signed)
[facility] Patient Name: Alejandro Kennedy Procedure Date: 02/25/2016 1:33 PM MRN: [id number] Date of Birth: 04/07/1981 Attending MD: Gennette Pacobert Michael Skylin Kennerson , MD CSN: [id number] Age: [age] Admit Type: Inpatient Procedure:                Colonoscopy - Hemoccult-positive stool Indications:              Heme positive stool Providers:                Gennette Pacobert Michael Cletus Mehlhoff, MD, Nena PolioLisa Moore, RN, Birder Robsonebra                            [place], Technician Referring MD:              Medicines:                Midazolam 7 mg IV; , Meperidine 100 mg IV,                            Ondansetron 4 mg IV Complications:            No immediate complications. Estimated Blood Loss:     Estimated blood loss: none. Procedure:                Pre-Anesthesia Assessment:                           - Prior to the procedure, a History and Physical                            was performed, and patient medications and                            allergies were reviewed. The patient's tolerance of                            previous anesthesia was also reviewed. The risks                            and benefits of the procedure and the sedation                            options and risks were discussed with the patient.                            All questions were answered, and informed consent                            was obtained. Prior Anticoagulants: The patient has                            taken no previous anticoagulant or antiplatelet                            agents. ASA Grade Assessment: II - A patient with  mild systemic disease. After reviewing the risks                            and benefits, the patient was deemed in                            satisfactory condition to undergo the procedure.                           After obtaining informed consent, the colonoscope                            was passed under direct vision. Throughout the                            procedure, the  patient's blood pressure, pulse, and                            oxygen saturations were monitored continuously. The                            EC-[id number] ([id number]) scope was introduced through                            the anus and advanced to the the cecum, identified                            by appendiceal orifice and ileocecal valve. The                            colonoscopy was performed without difficulty. The                            patient tolerated the procedure well. The quality                            of the bowel preparation was adequate. The terminal                            ileum, ileocecal valve, appendiceal orifice, and                            rectum were photographed. Scope In: 2:02:49 PM Scope Out: 2:17:05 PM Scope Withdrawal Time: 0 hours 9 minutes 3 seconds  Total Procedure Duration: 0 hours 14 minutes 16 seconds  Findings:      Non-bleeding hemorrhoids were found during retroflexion. The hemorrhoids       were moderate and Grade II (internal hemorrhoids that prolapse but       reduce spontaneously).      The entire examined colon appeared normal on direct and retroflexion       views. No other abnormalities found Estimated blood loss: none. Impression:               - Non-bleeding hemorrhoids.                           -  The entire examined colon is normal on direct and                            retroflexion views.                           - No specimens collected. Hemoccult positive stool                            likely secondary to hemorrhoids. No explanation for                            left upper quadrant abdominal pain. Moderate Sedation:      Moderate (conscious) sedation was administered by the endoscopy nurse       and supervised by the endoscopist. The following parameters were       monitored: oxygen saturation, heart rate, blood pressure, respiratory       rate, EKG, adequacy of pulmonary ventilation, and response to care.       Total  physician intraservice time was 32 minutes. Recommendation:           - Patient has a contact number available for                            emergencies. The signs and symptoms of potential                            delayed complications were discussed with the                            patient. Return to normal activities tomorrow.                            Written discharge instructions were provided to the                            patient.                           - Advance diet as tolerated.                           - Continue present medications.                           - Repeat colonoscopy at age 72 for screening                            purposes.                           - Return to GI office (date not yet determined).                            Continue Protonix 40 mg twice daily. Office visit  as planned. Further evaluation of abdominal pain                            may be required. Procedure Code(s):        --- Professional ---                           [phone], Colonoscopy, flexible; diagnostic, including                            collection of specimen(s) by brushing or washing,                            when performed (separate procedure)                           99152, Moderate sedation services provided by the                            same physician or other qualified health care                            professional performing the diagnostic or                            therapeutic service that the sedation supports,                            requiring the presence of an independent trained                            observer to assist in the monitoring of the                            patient's level of consciousness and physiological                            status; initial 15 minutes of intraservice time,                            patient age 34 years or older                           [phone], Moderate sedation services;  each additional                            15 minutes intraservice time Diagnosis Code(s):        --- Professional ---                           K64.1, Second degree hemorrhoids                           R19.5, Other fecal abnormalities CPT copyright 2016 American [employer] Association. All rights reserved. The codes documented in this report are preliminary and  upon coder review may  be revised to meet current compliance requirements. Gerrit Friends. Beonca Gibb, MD Gennette Pac, MD 02/26/2016 8:26:37 AM This report has been signed electronically. Number of Addenda: 0

## 2016-03-01 NOTE — Interval H&P Note (Deleted)
History and Physical Interval Note:  03/01/2016 12:28 PM  Alejandro Kennedy  has presented today for surgery, with the diagnosis of LUQ abd pain, hemocult positive stool  The various methods of treatment have been discussed with the patient and family. After consideration of risks, benefits and other options for treatment, the patient has consented to  Procedure(s) with comments: COLONOSCOPY (N/A) - 1:45 PM as a surgical intervention .  The patient's history has been reviewed, patient examined, no change in status, stable for surgery.  I have reviewed the patient's chart and labs.  Questions were answered to the patient's satisfaction.     Alejandro Kennedy  No change. Left-sided abdominal pain. Hemoccult-positive stool. Diagnostic colonoscopy per plan.  The risks, benefits, limitations, alternatives and imponderables have been reviewed with the patient. Questions have been answered. All parties are agreeable.

## 2016-03-17 ENCOUNTER — Encounter (HOSPITAL_COMMUNITY): Payer: Self-pay | Admitting: Internal Medicine

## 2016-03-19 NOTE — H&P (Signed)
Pre-Procedure History & Physical: HPI:  Alejandro Kennedy is a 35 y.o. male here for to follow-up of bothersome left upper quadrant abdominal pain of about 6 months duration. Was intermittent now nearly unrelenting. Not clearly affected by meal or bowel movement. Has intermittent breakthrough reflux symptoms on Protonix 40 mg daily. 2 week course of Dexilant made no difference in symptoms. Hasn't had any nausea or vomiting. No melena,  rectal bleeding; his stools have been dark on occasion. CT scan demonstrated quite a bit of stool burden in the colon-patient recalls having what he thought was a regular bowel movement just before the CT scan. Hydrogen breath test negative EGD. No right-sided pain or any history of gallbladder issues. Labs okay. He gained 40 pounds in the past 3 years. Fatty liver on CT.  Does not wear CPAP although he has it at home. 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  Exacerbations of left upper quadrant abdominal pain -  may last 15-20 minutes.Can be incapacitating. No temporal relationship with a bowel movement as stated. Wife also notes he has a "metallic" odor when he has attack and then it rapidly subsides.      Past Medical History:  Diagnosis Date  . Generalized headaches   . Hepatic steatosis   . Sleep apnea   . Ulcer    per pt was told he had an ulcer, no prior EGD         Past Surgical History:  Procedure Laterality Date  . cyst removed  2004   from throat  . ESOPHAGOGASTRODUODENOSCOPY  06/16/2011   Dr.Fields- mild gastritis, bx= mild chronic inflammation  . ESOPHAGOGASTRODUODENOSCOPY  10/20/2015   Dr. Joslyn Hy- normal esophagus, normal duodenum, erythema and heme in the antrum and stomach body compatible with gastritis  . HYDROGEN BREATH TEST  07/14/2011   Procedure: HYDROGEN BREATH TEST;  Surgeon: Arlyce Harman, MD;  Location: [facility];  Service: Endoscopy;  Laterality: N/A;  EVALUATE FOR LACTOSE INTOLERANCE            Prior to  Admission medications   Medication Sig Start Date End Date Taking? Authorizing Provider  Cholecalciferol (VITAMIN D-1000 MAX ST) 1000 units tablet Take 1,000 Units by mouth daily.    Yes Historical Provider, MD  lidocaine (XYLOCAINE) 2 % solution Use as directed 15 mLs in the mouth or throat every 6 (six) hours as needed (worsening reflux). 01/07/16  Yes Anice Paganini, NP  Omega-3 Fatty Acids (FISH OIL) 1000 MG CAPS Take 1 capsule by mouth 2 (two) times daily.   Yes Historical Provider, MD  pantoprazole (PROTONIX) 40 MG tablet Take 1 tablet (40 mg total) by mouth daily. 06/04/13  Yes Salley Scarlet, MD  dicyclomine (BENTYL) 20 MG tablet Take 10 mg by mouth 4 (four) times daily -  before meals and at bedtime.    Historical Provider, MD  ondansetron (ZOFRAN) 4 MG tablet Take 1 tablet (4 mg total) by mouth every 6 (six) hours. Patient not taking: Reported on 02/20/2016 09/15/15   Tammy Triplett, PA-C       Allergies as of 02/20/2016  . (No Known Allergies)         Family History  Problem Relation Age of Onset  . Diabetes Mother   . Hypertension Mother   . Diabetes Father   . Heart disease    . Kidney disease    . Arthritis    . Colon cancer Neg Hx     Social History  Social History  . Marital status: Married    Spouse name: N/A  . Number of children: N/A  . Years of education: N/A       Occupational History  . [employer]         Social History Main Topics  . Smoking status: Never Smoker  . Smokeless tobacco: Never Used  . Alcohol use Yes     Comment: rarely  . Drug use: No  . Sexual activity: Not on file       Other Topics Concern  . Not on file      Social History Narrative  . No narrative on file    Review of Systems: See HPI, otherwise negative ROS  Physical Exam: BP (!) 157/84   Pulse 83   Temp 98.3 F (36.8 C) (Oral)   Ht 6\' 4"  (1.93 m)   Wt (!) 363 lb (164.7 kg)   BMI  44.19 kg/m  General:   Alert,   pleasant and cooperative in NAD Skin:  Intact without significant lesions or rashes. Lungs:  Clear throughout to auscultation.   No wheezes, crackles, or rhonchi. No acute distress. Heart:  Regular rate and rhythm; no murmurs, clicks, rubs,  or gallops. Abdomen: obese.Non-distended, normal bowel sounds.  Soft and nontender without appreciable mass or hepatosplenomegaly.  Pulses:  Normal pulses noted. Extremities:  Without clubbing or edema. Rectal: Good sphincter tone no mass rectal vault prostate palpated to be normal. Scant brown stool. Hemoccult positive.   Impression:  35 year old morbidly obese gentleman with a GERD fairly well-controlled 6 month history of left upper quadrant abdominal pain. Multiple studies negative. Stool burden in colon noted may or may not be related to pain. He is Hemoccult positive. He needs to have a colonoscopy although told him we might not find a well-defined calls of abdominal pain with this procedure but it needs to be done.  The risks, benefits, limitations, alternatives and imponderables have been reviewed with the patient. Questions have been answered. All parties are agreeable.   Recommendations:   Schedule a colonoscopy - LUQ abdominal pain / hemocult positive stool  Increase Protonix to 40 mg twice daily  Sleep apnea needs to be treated effectively  Weight loss recommended  Further recommendations to follow   Notice: Thisdictation was prepared with Dragon dictation along with smaller phrase technology. Any transcriptional errors that result from this process are unintentional and may not be corrected upon review.

## 2016-03-24 ENCOUNTER — Encounter: Payer: Self-pay | Admitting: Gastroenterology

## 2016-03-24 ENCOUNTER — Ambulatory Visit: Payer: BC Managed Care – PPO | Admitting: Gastroenterology

## 2016-03-24 ENCOUNTER — Telehealth: Payer: Self-pay | Admitting: Gastroenterology

## 2016-03-24 NOTE — Telephone Encounter (Signed)
PT WAS A NO SHOW AND LETTER SENT  °

## 2017-11-02 IMAGING — CT CT ABD-PELV W/ CM
2 of 4 series · 16 of 46 positions shown, 18 images · IV contrast (Omnipaque 300)
Comparison: 05/03/2011

CLINICAL DATA: Left upper quadrant pay for several weeks with
worsening last 2 days

EXAM:
CT ABDOMEN AND PELVIS WITH CONTRAST
TECHNIQUE: Multidetector CT imaging of the abdomen and pelvis was performed
using the standard protocol following bolus administration of
intravenous contrast.
CONTRAST:  125mL OMNIPAQUE IOHEXOL 300 MG/ML  SOLN

[Series 2: abd_pel_with 5.0 b40s · axial · 0.94mm/px · z∈[-527,-32]mm · 13 of 109 slices shown, 15 images]
[im 5/109  soft-tissue]
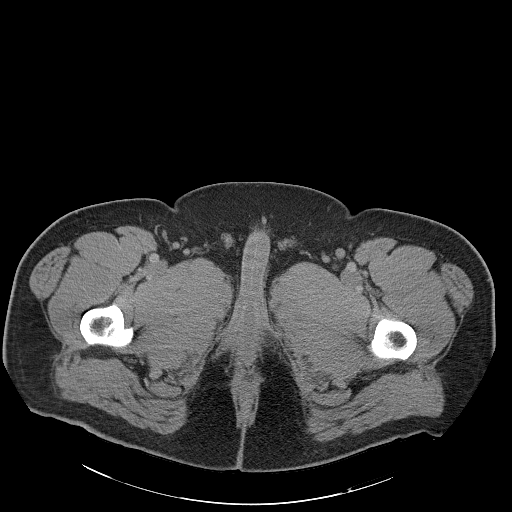
[im 5/109  bone]
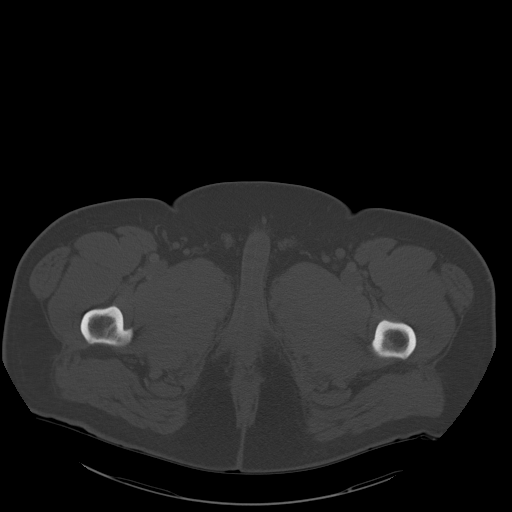
[im 14/109  soft-tissue]
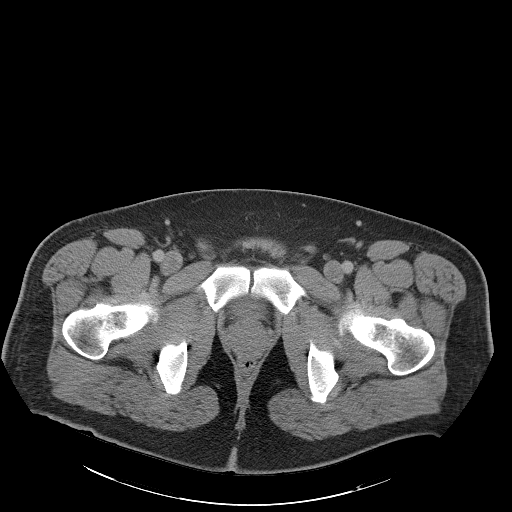
[im 23/109  soft-tissue]
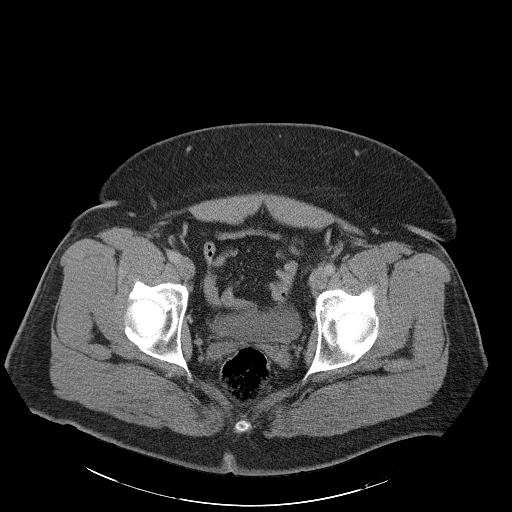
[im 32/109  soft-tissue]
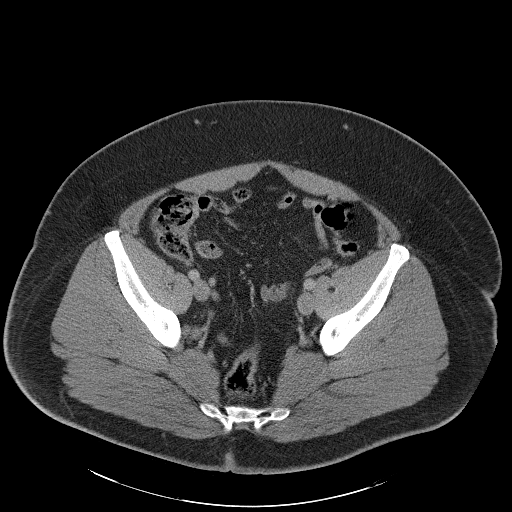
[im 37/109  soft-tissue]
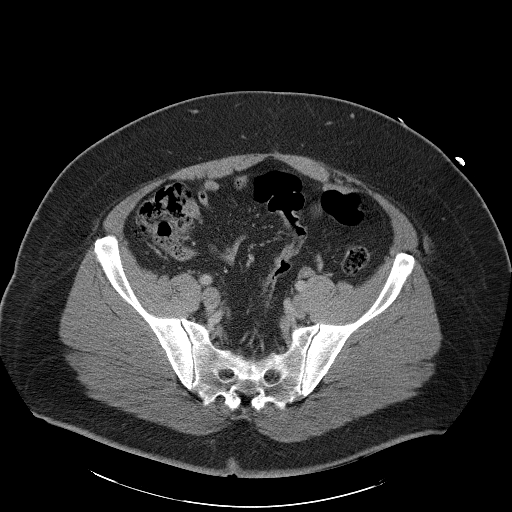
[im 46/109  soft-tissue]
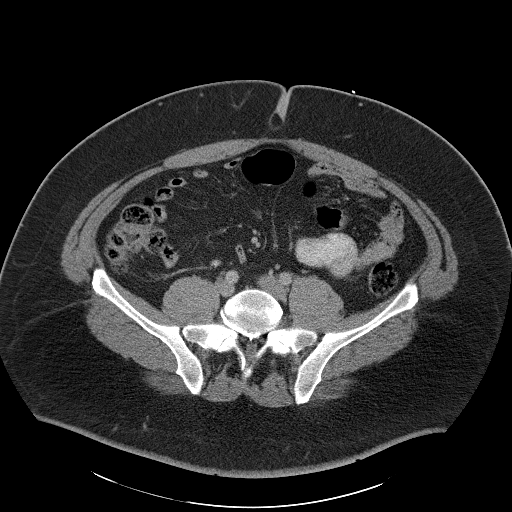
[im 55/109  soft-tissue]
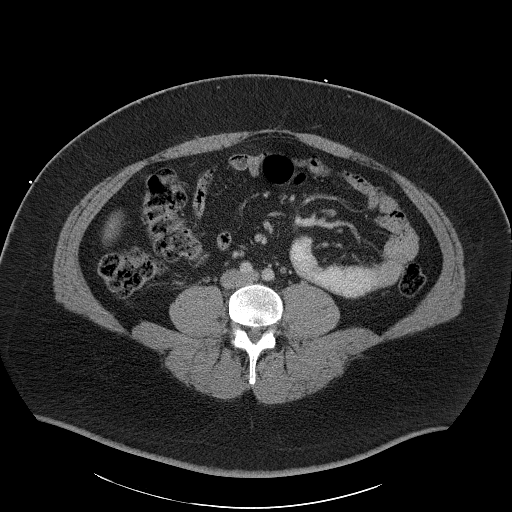
[im 64/109  soft-tissue]
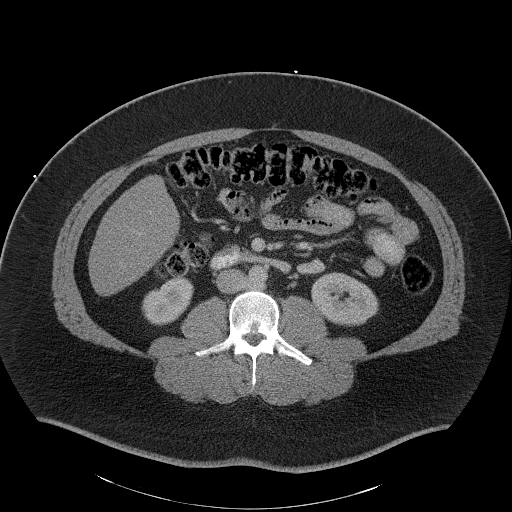
[im 73/109  soft-tissue]
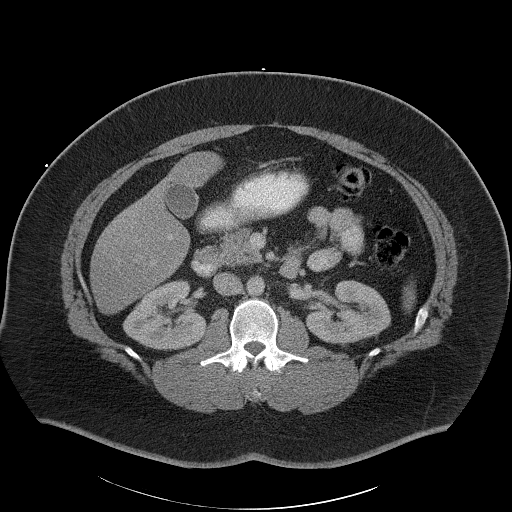
[im 73/109  bone]
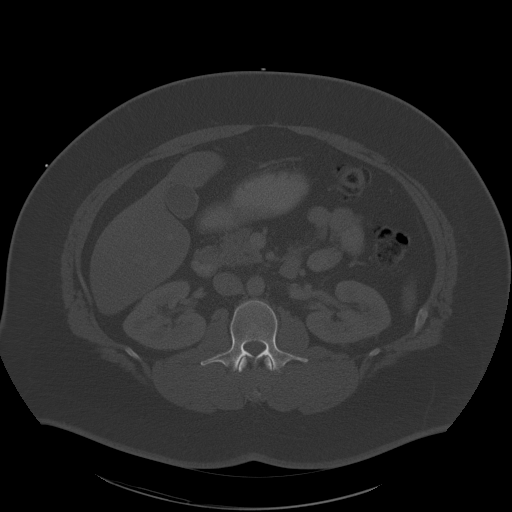
[im 77/109  soft-tissue]
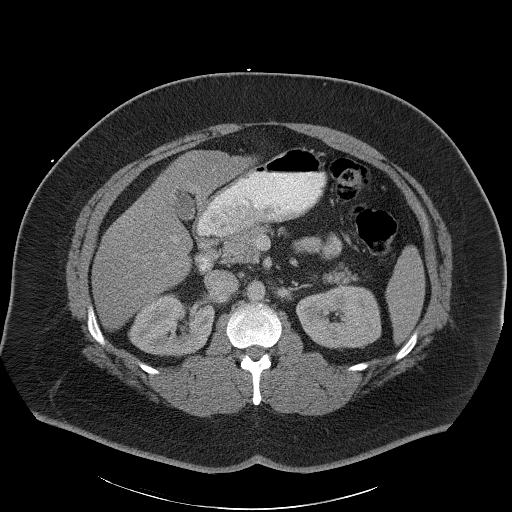
[im 86/109  soft-tissue]
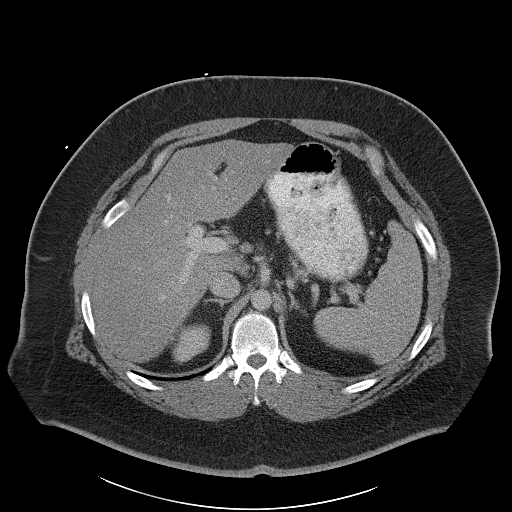
[im 95/109  soft-tissue]
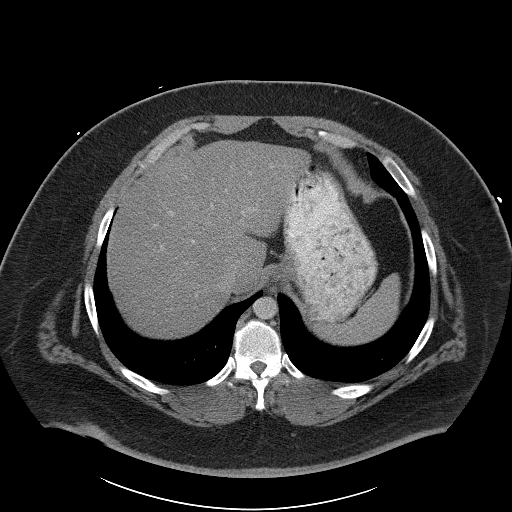
[im 104/109  soft-tissue]
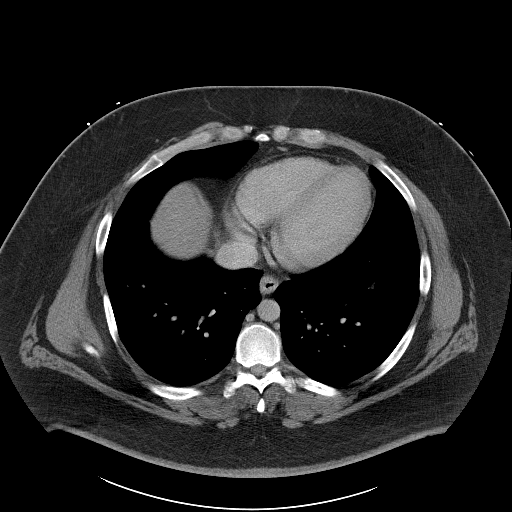

[Series 4: mpr cor post contrast (id) · coronal · 0.96mm/px · 3 of 124 slices shown]
[im 42/124  soft-tissue]
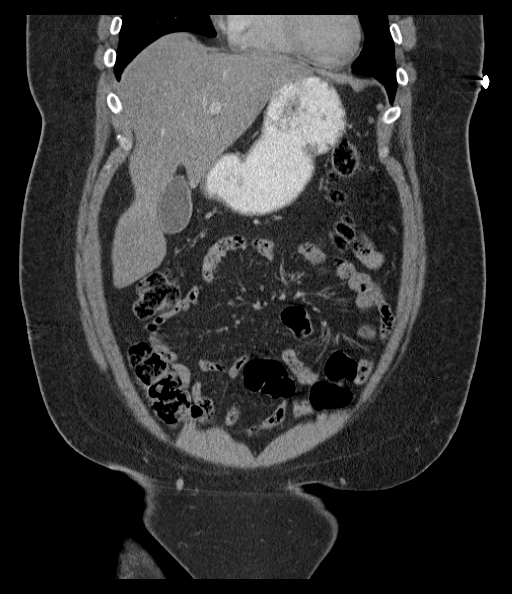
[im 55/124  soft-tissue]
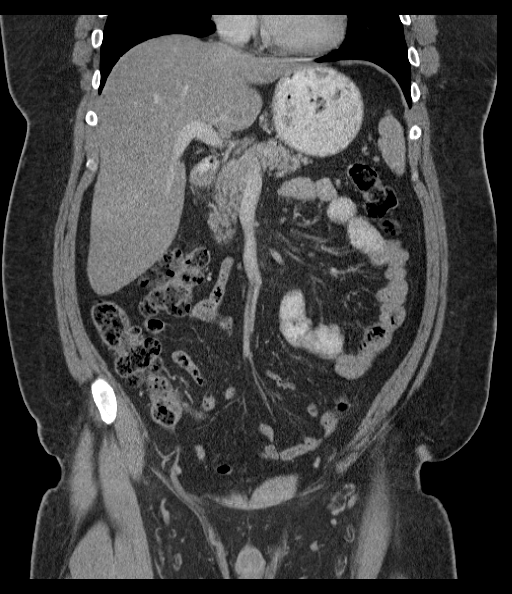
[im 69/124  soft-tissue]
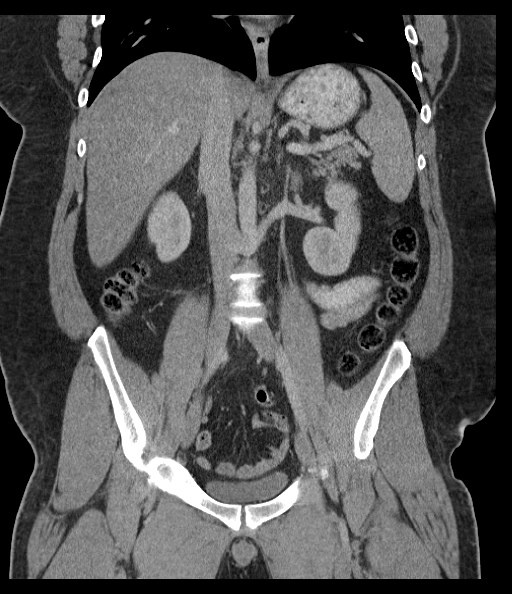

[16 of 46 positions shown; findings below may reference images not displayed]

FINDINGS: Lower chest:  Lung bases are unremarkable.  Tiny hiatal hernia.

Hepatobiliary: Again noted fatty infiltration of the liver. No focal
mass. No calcified gallstones are noted within gallbladder. No
intrahepatic biliary ductal dilatation.

Pancreas: Enhanced pancreas is unremarkable.

Spleen: Enhanced spleen is unremarkable.

Adrenals/Urinary Tract: No adrenal gland mass. Enhanced kidneys are
symmetrical in size. No hydronephrosis or hydroureter. The urinary
bladder is unremarkable. Bilateral distal ureter is unremarkable.

Stomach/Bowel: There is no gastric outlet obstruction. No small
bowel obstruction. No thickened or dilated small bowel loops.
Moderate stool noted in right colon. No pericecal inflammation.
Normal appendix is noted in axial image 75. The terminal ileum is
unremarkable. Moderate stool noted in transverse colon and
descending colon. No evidence of colitis or diverticulitis. Moderate
colonic gas noted in proximal sigmoid colon. Moderate stool noted in
rectum. There is no distal colonic obstruction.

Vascular/Lymphatic: No retroperitoneal or mesenteric adenopathy. No
aortic aneurysm.

Reproductive: Prostate gland and seminal vesicles are unremarkable.

Other: Tiny umbilical hernia containing fat without evidence of
acute complication. There is no abdominal ascites or free air.

Musculoskeletal: No destructive bony lesions are noted. Sagittal
images of the spine shows mild degenerative changes lower lumbar
spine. Minimal disc bulge with posterior spurring at L3-L4-L4-L5 and
L5-S1 level.
IMPRESSION: 1. There is no evidence of acute inflammatory process within abdomen
or pelvis.
2. No pericecal inflammation. Normal appendix. Moderate stool noted
in right colon transverse colon and descending colon. Moderate gas
noted in proximal sigmoid colon. Moderate stool noted within rectum.
3. No hydronephrosis or hydroureter.
4. Again noted fatty infiltration of the liver.
5. Mild degenerative changes lumbar spine.
6. No small bowel obstruction.

## 2017-12-13 ENCOUNTER — Other Ambulatory Visit: Payer: Self-pay

## 2017-12-13 ENCOUNTER — Emergency Department (HOSPITAL_COMMUNITY)
Admission: EM | Admit: 2017-12-13 | Discharge: 2017-12-13 | Disposition: A | Discharge status: Home / Self Care | Payer: BC Managed Care – PPO | Attending: Emergency Medicine | Admitting: Emergency Medicine

## 2017-12-13 ENCOUNTER — Encounter (HOSPITAL_COMMUNITY): Payer: Self-pay | Admitting: Emergency Medicine

## 2017-12-13 DIAGNOSIS — S61211A Laceration without foreign body of left index finger without damage to nail, initial encounter: Secondary | ICD-10-CM | POA: Diagnosis present

## 2017-12-13 DIAGNOSIS — Y93G1 Activity, food preparation and clean up: Secondary | ICD-10-CM | POA: Diagnosis not present

## 2017-12-13 DIAGNOSIS — Z79899 Other long term (current) drug therapy: Secondary | ICD-10-CM | POA: Insufficient documentation

## 2017-12-13 DIAGNOSIS — Y999 Unspecified external cause status: Secondary | ICD-10-CM | POA: Insufficient documentation

## 2017-12-13 DIAGNOSIS — Z23 Encounter for immunization: Secondary | ICD-10-CM | POA: Diagnosis not present

## 2017-12-13 DIAGNOSIS — Y929 Unspecified place or not applicable: Secondary | ICD-10-CM | POA: Insufficient documentation

## 2017-12-13 DIAGNOSIS — W260XXA Contact with knife, initial encounter: Secondary | ICD-10-CM | POA: Insufficient documentation

## 2017-12-13 MED ORDER — TETANUS-DIPHTH-ACELL PERTUSSIS 5-2.5-18.5 LF-MCG/0.5 IM SUSP
0.5000 mL | Freq: Once | INTRAMUSCULAR | Status: AC
  Administered 2017-12-13: 0.5 mL via INTRAMUSCULAR
  Filled 2017-12-13: qty 0.5

## 2017-12-13 MED ORDER — LIDOCAINE HCL (PF) 1 % IJ SOLN
INTRAMUSCULAR | Status: AC
  Administered 2017-12-13: 5 mL
  Filled 2017-12-13: qty 6

## 2017-12-13 NOTE — Discharge Instructions (Addendum)
Keep the wound clean daily with soap and water, beginning tomorrow.  Have sutures removed in 7 or 8 days.  Return here if needed for problems.

## 2017-12-13 NOTE — ED Provider Notes (Signed)
[facility] EMERGENCY DEPARTMENT Provider Note   CSN: [id number] Arrival date & time: 12/13/17  0749     History   Chief Complaint Chief Complaint  Patient presents with  . Laceration    HPI Alejandro Kennedy is a 37 y.o. male.  HPI   He reports injuring himself, accidentally, this morning around 7 AM, cutting salad with a knife.  He injured his left index finger.  He is right-handed.  He works as an Web designer.  He denies other injuries or other problems concurrently.  There are no other known modifying factors.  Past Medical History:  Diagnosis Date  . Generalized headaches   . Hepatic steatosis   . Sleep apnea   . Ulcer    per pt was told he had an ulcer, no prior EGD    Patient Active Problem List   Diagnosis Date Noted  . LUQ pain 01/07/2016  . Gastritis and gastroduodenitis 01/07/2016  . OSA on CPAP 08/13/2013  . Other malaise and fatigue 06/04/2013  . GERD (gastroesophageal reflux disease) 06/04/2013  . Acute URI 06/04/2013  . Sciatica 06/04/2013  . Alopecia 07/16/2011  . Hyperlipidemia 03/01/2011  . INSOMNIA 01/20/2010  . OBESITY, MORBID 11/21/2009    Past Surgical History:  Procedure Laterality Date  . COLONOSCOPY N/A 02/25/2016   Procedure: COLONOSCOPY;  Surgeon: Daneil Dolin, MD;  [facility];  Service: Endoscopy;  Laterality: N/A;  1:45 PM  . cyst removed  2004   from throat  . ESOPHAGOGASTRODUODENOSCOPY  06/16/2011   Dr.Fields- mild gastritis, bx= mild chronic inflammation  . ESOPHAGOGASTRODUODENOSCOPY  10/20/2015   Dr. Delle Reining- normal esophagus, normal duodenum, erythema and heme in the antrum and stomach body compatible with gastritis  . HYDROGEN BREATH TEST  07/14/2011   Procedure: HYDROGEN BREATH TEST;  Surgeon: Dorothyann Peng, MD;  Location: [facility];  Service: Endoscopy;  Laterality: N/A;  EVALUATE FOR LACTOSE INTOLERANCE         Home Medications    Prior to Admission medications   Medication Sig Start Date  End Date Taking? Authorizing Provider  Cholecalciferol (VITAMIN D-1000 MAX ST) 1000 units tablet Take 1,000 Units by mouth daily.     [provider]  dicyclomine (BENTYL) 20 MG tablet Take 10 mg by mouth 4 (four) times daily -  before meals and at bedtime.    [provider]  lidocaine (XYLOCAINE) 2 % solution Use as directed 15 mLs in the mouth or throat every 6 (six) hours as needed (worsening reflux). 01/07/16   Carlis Stable, NP  Omega-3 Fatty Acids (FISH OIL) 1000 MG CAPS Take 1 capsule by mouth 2 (two) times daily.    [provider]  ondansetron (ZOFRAN) 4 MG tablet Take 1 tablet (4 mg total) by mouth every 6 (six) hours. 09/15/15   Triplett, Tammy, PA-C  pantoprazole (PROTONIX) 40 MG tablet Take 1 tablet (40 mg total) by mouth daily. 06/04/13   Alycia Rossetti, MD  peg 3350 powder (MOVIPREP) 100 g SOLR Take 1 kit (200 g total) by mouth as directed. 02/20/16   Rourk, Cristopher Estimable, MD    Family History Family History  Problem Relation Age of Onset  . Diabetes Mother   . Hypertension Mother   . Diabetes Father   . Heart disease Unknown   . Kidney disease Unknown   . Arthritis Unknown   . Colon cancer Neg Hx     Social History Social History   Tobacco Use  .  Smoking status: Never Smoker  . Smokeless tobacco: Never Used  Substance Use Topics  . Alcohol use: Yes    Comment: rarely  . Drug use: No     Allergies   Patient has no known allergies.   Review of Systems Review of Systems  All other systems reviewed and are negative.    Physical Exam Updated Vital Signs BP 135/78 (BP Location: Right Arm)   Pulse 72   Temp 97.7 F (36.5 C) (Oral)   Resp 19   SpO2 98%   Physical Exam  Constitutional: He is oriented to person, place, and time. He appears well-developed and well-nourished.  HENT:  Head: Normocephalic and atraumatic.  Right Ear: External ear normal.  Left Ear: External ear normal.  Eyes: Pupils are equal, round, and reactive to  light. Conjunctivae and EOM are normal.  Neck: Normal range of motion and phonation normal. Neck supple.  Cardiovascular: Normal rate.  Pulmonary/Chest: Effort normal. He exhibits no bony tenderness.  Musculoskeletal: Normal range of motion.  Left index finger, volar laceration at proximal base, gaping somewhat, with mild bleeding.  Neurovascularly intact distally in the left index finger and functional intact left index finger.  Neurological: He is alert and oriented to person, place, and time. No cranial nerve deficit or sensory deficit. He exhibits normal muscle tone. Coordination normal.  Skin: Skin is warm, dry and intact.  Psychiatric: He has a normal mood and affect. His behavior is normal. Judgment and thought content normal.  Nursing note and vitals reviewed.    ED Treatments / Results  Labs (all labs ordered are listed, but only abnormal results are displayed) Labs Reviewed - No data to display  EKG None  Radiology No results found.  Procedures .Marland KitchenLaceration Repair Date/Time: 12/13/2017 8:59 AM Performed by: Daleen Bo, MD Authorized by: Daleen Bo, MD   Consent:    Consent obtained:  Verbal   Risks discussed:  Pain and poor wound healing   Alternatives discussed:  No treatment and observation Anesthesia (see MAR for exact dosages):    Anesthesia method:  Local infiltration   Local anesthetic:  Lidocaine 1% w/o epi Laceration details:    Location: Left index finger.   Length (cm):  2.5   Depth (mm):  5 Repair type:    Repair type:  Simple Pre-procedure details:    Preparation:  Patient was prepped and draped in usual sterile fashion and imaging obtained to evaluate for foreign bodies Exploration:    Hemostasis achieved with:  Direct pressure   Wound exploration: wound explored through full range of motion     Wound extent: no fascia violation noted, no foreign bodies/material noted, no nerve damage noted, no tendon damage noted, no underlying fracture  noted and no vascular damage noted     Contaminated: no   Treatment:    Area cleansed with:  Betadine   Irrigation solution:  Sterile saline   Irrigation method:  Syringe   Visualized foreign bodies/material removed: no   Skin repair:    Repair method:  Sutures   Suture size:  4-0   Suture material:  Nylon Approximation:    Approximation:  Close Post-procedure details:    Dressing:  Antibiotic ointment and sterile dressing   Patient tolerance of procedure:  Tolerated well, no immediate complications   (including critical care time)  Medications Ordered in ED Medications  Tdap (BOOSTRIX) injection 0.5 mL (0.5 mLs Intramuscular Given 12/13/17 0809)  lidocaine (PF) (XYLOCAINE) 1 % injection (5 mLs  Given  12/13/17 1025)     Initial Impression / Assessment and Plan / ED Course  I have reviewed the triage vital signs and the nursing notes.  Pertinent labs & imaging results that were available during my care of the patient were reviewed by me and considered in my medical decision making (see chart for details).      Patient Vitals for the past 24 hrs:  BP Temp Temp src Pulse Resp SpO2  12/13/17 0756 135/78 97.7 F (36.5 C) Oral 72 19 98 %    9:00 AM Reevaluation with update and discussion. After initial assessment and treatment, an updated evaluation reveals he is comfortable has no further complaints.  Findings discussed and questions answered. Daleen Bo   Medical Decision Making: Superficial laceration left index finger closed with suture, to improve healing, and decreased complications.  CRITICAL CARE-no Performed by: Daleen Bo   Nursing Notes Reviewed/ Care Coordinated Applicable Imaging Reviewed Interpretation of Laboratory Data incorporated into ED treatment  The patient appears reasonably screened and/or stabilized for discharge and I doubt any other medical condition or other [facility] requiring further screening, evaluation, or treatment in the ED at this time prior  to discharge.  Plan: Home Medications-OTC analgesia PRN; Home Treatments-wound care at home; return here if the recommended treatment, does not improve the symptoms; Recommended follow up-suture removal 7 to 8 days     Final Clinical Impressions(s) / ED Diagnoses   Final diagnoses:  Laceration of left index finger without foreign body without damage to nail, initial encounter    ED Discharge Orders    None       Daleen Bo, MD 12/13/17 [phone]

## 2017-12-13 NOTE — ED Triage Notes (Signed)
Pt cut anterior proximal index finger at hand preparing food pta. Last Tet 2012. Bleeding controlled. Gapping wound noted.

## 2021-10-27 ENCOUNTER — Ambulatory Visit: Payer: BC Managed Care – PPO | Admitting: Urology

## 2021-10-27 NOTE — Progress Notes (Incomplete)
H&P ? ?Chief Complaint: *** ? ?History of Present Illness: *** ? ?Past Medical History:  ?Diagnosis Date  ? Generalized headaches   ? Hepatic steatosis   ? Sleep apnea   ? Ulcer   ? per pt was told he had an ulcer, no prior EGD  ? ? ?Past Surgical History:  ?Procedure Laterality Date  ? COLONOSCOPY N/A 02/25/2016  ? Procedure: COLONOSCOPY;  Surgeon: Daneil Dolin, MD;  [facility];  Service: Endoscopy;  Laterality: N/A;  1:45 PM  ? cyst removed  2004  ? from throat  ? ESOPHAGOGASTRODUODENOSCOPY  06/16/2011  ? Dr.Fields- mild gastritis, bx= mild chronic inflammation  ? ESOPHAGOGASTRODUODENOSCOPY  10/20/2015  ? Dr. Delle Reining- normal esophagus, normal duodenum, erythema and heme in the antrum and stomach body compatible with gastritis  ? HYDROGEN BREATH TEST  07/14/2011  ? Procedure: HYDROGEN BREATH TEST;  Surgeon: Dorothyann Peng, MD;  [facility]: [facility];  Service: Endoscopy;  Laterality: N/A;  EVALUATE FOR LACTOSE INTOLERANCE   ? ? ?Home Medications:  ?Allergies as of 10/27/2021   ?No Known Allergies ?  ? ?  ?Medication List  ?  ? ?  ? Accurate as of Oct 27, 2021  6:50 AM. If you have any questions, ask your nurse or doctor.  ?  ?  ? ?  ? ?dicyclomine 20 MG tablet ?Commonly known as: BENTYL ?Take 10 mg by mouth 4 (four) times daily -  before meals and at bedtime. ?  ?Fish Oil 1000 MG Caps ?Take 1 capsule by mouth 2 (two) times daily. ?  ?lidocaine 2 % solution ?Commonly known as: XYLOCAINE ?Use as directed 15 mLs in the mouth or throat every 6 (six) hours as needed (worsening reflux). ?  ?ondansetron 4 MG tablet ?Commonly known as: ZOFRAN ?Take 1 tablet (4 mg total) by mouth every 6 (six) hours. ?  ?pantoprazole 40 MG tablet ?Commonly known as: PROTONIX ?Take 1 tablet (40 mg total) by mouth daily. ?  ?peg 3350 powder 100 g Solr ?Commonly known as: MOVIPREP ?Take 1 kit (200 g total) by mouth as directed. ?  ?Vitamin D-1000 Max St 25 MCG (1000 UT) tablet ?Generic drug: Cholecalciferol ?Take 1,000 Units by  mouth daily. ?  ? ?  ? ? ?Allergies: No Known Allergies ? ?Family History  ?Problem Relation Age of Onset  ? Diabetes Mother   ? Hypertension Mother   ? Diabetes Father   ? Heart disease Unknown   ? Kidney disease Unknown   ? Arthritis Unknown   ? Colon cancer Neg Hx   ? ? ?Social History:  reports that he has never smoked. He has never used smokeless tobacco. He reports current alcohol use. He reports that he does not use drugs. ? ?ROS: ?A complete review of systems was performed.  All systems are negative except for pertinent findings as noted. ? ?Physical Exam:  ?Vital signs in last 24 hours: ?There were no vitals taken for this visit. ?Constitutional:  Alert and oriented, No acute distress ?Cardiovascular: Regular rate  ?Respiratory: Normal respiratory effort ?Genitourinary: Normal male phallus, testes are descended bilaterally and non-tender and without masses, scrotum is normal in appearance without lesions or masses, perineum is normal on inspection. ?Lymphatic: No lymphadenopathy ?Neurologic: Grossly intact, no focal deficits ?Psychiatric: Normal mood and affect ? ?I have reviewed prior pt notes ? ?I have reviewed notes from referring/previous physicians ? ? ? ?Impression/Assessment:  ?*** ? ?Plan:  ?*** ? ?

## 2022-02-05 ENCOUNTER — Ambulatory Visit
Admission: EM | Admit: 2022-02-05 | Discharge: 2022-02-05 | Disposition: A | Discharge status: Home / Self Care | Payer: BC Managed Care – PPO | Attending: Family Medicine | Admitting: Family Medicine

## 2022-02-05 ENCOUNTER — Encounter: Payer: Self-pay | Admitting: Emergency Medicine

## 2022-02-05 DIAGNOSIS — R1084 Generalized abdominal pain: Secondary | ICD-10-CM | POA: Diagnosis not present

## 2022-02-05 DIAGNOSIS — R197 Diarrhea, unspecified: Secondary | ICD-10-CM | POA: Insufficient documentation

## 2022-02-05 DIAGNOSIS — R509 Fever, unspecified: Secondary | ICD-10-CM | POA: Insufficient documentation

## 2022-02-05 DIAGNOSIS — Z8719 Personal history of other diseases of the digestive system: Secondary | ICD-10-CM | POA: Diagnosis not present

## 2022-02-05 DIAGNOSIS — K219 Gastro-esophageal reflux disease without esophagitis: Secondary | ICD-10-CM | POA: Diagnosis not present

## 2022-02-05 DIAGNOSIS — Z20822 Contact with and (suspected) exposure to covid-19: Secondary | ICD-10-CM | POA: Diagnosis not present

## 2022-02-05 HISTORY — DX: Essential (primary) hypertension: I10

## 2022-02-05 HISTORY — DX: Pure hypercholesterolemia, unspecified: E78.00

## 2022-02-05 LAB — C DIFFICILE QUICK SCREEN W PCR REFLEX
C Diff antigen: NEGATIVE
C Diff interpretation: NOT DETECTED
C Diff toxin: NEGATIVE

## 2022-02-05 LAB — RESP PANEL BY RT-PCR (RSV, FLU A&B, COVID)  RVPGX2
Influenza A by PCR: NEGATIVE
Influenza B by PCR: NEGATIVE
Resp Syncytial Virus by PCR: NEGATIVE
SARS Coronavirus 2 by RT PCR: NEGATIVE

## 2022-02-05 MED ORDER — CIPROFLOXACIN HCL 500 MG PO TABS: 500.0000 mg | ORAL_TABLET | Freq: Two times a day (BID) | ORAL | 0 refills | Status: AC

## 2022-02-05 NOTE — ED Triage Notes (Signed)
Diarrhea since Saturday, feels weak, fever today.

## 2022-02-05 NOTE — Discharge Instructions (Signed)
Eat bland foods, drink plenty of fluids including electrolyte solutions and go to the emergency department if your symptoms worsen at any time.

## 2022-02-05 NOTE — ED Provider Notes (Signed)
[facility]    CSN: [id number] Arrival date & time: 02/05/22  1730      History   Chief Complaint No chief complaint on file.   HPI Alejandro Kennedy is a 41 y.o. male.   Patient presenting today with almost a week of waxing waning symptoms.  States he started with diarrhea on Saturday which progressed Sunday to generalized abdominal pain, fatigue, sweats, chills, diarrhea about 7-8 times a day.  States this started to ease up about 3 days ago and he thought he was feeling a bit better until today started back with diarrhea, weakness and fever up to 101 F, sweats.  Denies cough, chest pain, shortness of breath, sore throat, nausea, vomiting.  Took some Tylenol after work today and has felt mildly improved since then.  No known sick contacts recently, no new medications, recent travel.  Does have a history of GERD, gastritis but states this feels very different.    Past Medical History:  Diagnosis Date   Generalized headaches    Hepatic steatosis    High cholesterol    Hypertension    Sleep apnea    Ulcer    per pt was told he had an ulcer, no prior EGD    Patient Active Problem List   Diagnosis Date Noted   LUQ pain 01/07/2016   Gastritis and gastroduodenitis 01/07/2016   OSA on CPAP 08/13/2013   Other malaise and fatigue 06/04/2013   GERD (gastroesophageal reflux disease) 06/04/2013   Acute URI 06/04/2013   Sciatica 06/04/2013   Alopecia 07/16/2011   Hyperlipidemia 03/01/2011   INSOMNIA 01/20/2010   OBESITY, MORBID 11/21/2009    Past Surgical History:  Procedure Laterality Date   COLONOSCOPY N/A 02/25/2016   Procedure: COLONOSCOPY;  Surgeon: Corbin Ade, MD;  [facility];  Service: Endoscopy;  Laterality: N/A;  1:45 PM   cyst removed  2004   from throat   ESOPHAGOGASTRODUODENOSCOPY  06/16/2011   Dr.Fields- mild gastritis, bx= mild chronic inflammation   ESOPHAGOGASTRODUODENOSCOPY  10/20/2015   Dr. Joslyn Hy- normal esophagus, normal  duodenum, erythema and heme in the antrum and stomach body compatible with gastritis   HYDROGEN BREATH TEST  07/14/2011   Procedure: HYDROGEN BREATH TEST;  Surgeon: Arlyce Harman, MD;  [facility];  Service: Endoscopy;  Laterality: N/A;  EVALUATE FOR LACTOSE INTOLERANCE        Home Medications    Prior to Admission medications   Medication Sig Start Date End Date Taking? Authorizing Provider  atorvastatin (LIPITOR) 20 MG tablet Take 20 mg by mouth daily.   Yes [provider]  ciprofloxacin (CIPRO) 500 MG tablet Take 1 tablet (500 mg total) by mouth 2 (two) times daily. 02/05/22  Yes Particia Nearing, PA-C  lisinopril (ZESTRIL) 10 MG tablet Take 10 mg by mouth daily.   Yes [provider]  Cholecalciferol (VITAMIN D-1000 MAX ST) 1000 units tablet Take 1,000 Units by mouth daily.     [provider]  dicyclomine (BENTYL) 20 MG tablet Take 10 mg by mouth 4 (four) times daily -  before meals and at bedtime.    [provider]  lidocaine (XYLOCAINE) 2 % solution Use as directed 15 mLs in the mouth or throat every 6 (six) hours as needed (worsening reflux). 01/07/16   Anice Paganini, NP  Omega-3 Fatty Acids (FISH OIL) 1000 MG CAPS Take 1 capsule by mouth 2 (two) times daily.    [provider]  ondansetron (ZOFRAN) 4 MG tablet Take 1 tablet (4 mg total) by mouth every 6 (six) hours. 09/15/15   Triplett, Tammy, PA-C  pantoprazole (PROTONIX) 40 MG tablet Take 1 tablet (40 mg total) by mouth daily. 06/04/13   Salley Scarlet, MD  peg 3350 powder (MOVIPREP) 100 g SOLR Take 1 kit (200 g total) by mouth as directed. 02/20/16   Rourk, Gerrit Friends, MD    Family History Family History  Problem Relation Age of Onset   Diabetes Mother    Hypertension Mother    Diabetes Father    Heart disease Unknown    Kidney disease Unknown    Arthritis Unknown    Colon cancer Neg Hx     Social History Social History   Tobacco Use   Smoking status: Never    Smokeless tobacco: Never  Substance Use Topics   Alcohol use: Yes    Comment: rarely   Drug use: No     Allergies   Amoxil [amoxicillin]   Review of Systems Review of Systems Per HPI  Physical Exam Triage Vital Signs ED Triage Vitals  Enc Vitals Group     BP 02/05/22 1737 (!) 154/80     Pulse Rate 02/05/22 1737 96     Resp 02/05/22 1737 18     Temp 02/05/22 1737 98.7 F (37.1 C)     Temp Source 02/05/22 1737 Oral     SpO2 02/05/22 1737 98 %     Weight --      Height --      Head Circumference --      Peak Flow --      Pain Score 02/05/22 1738 5     Pain Loc --      Pain Edu? --      Excl. in GC? --    No data found.  Updated Vital Signs BP (!) 154/80 (BP Location: Right Arm)   Pulse 96   Temp 98.7 F (37.1 C) (Oral)   Resp 18   SpO2 98%   Visual Acuity Right Eye Distance:   Left Eye Distance:   Bilateral Distance:    Right Eye Near:   Left Eye Near:    Bilateral Near:     Physical Exam Vitals and nursing note reviewed.  Constitutional:      Appearance: Normal appearance.  HENT:     Head: Atraumatic.     Nose: Nose normal.     Mouth/Throat:     Mouth: Mucous membranes are moist.  Eyes:     Extraocular Movements: Extraocular movements intact.     Conjunctiva/sclera: Conjunctivae normal.  Cardiovascular:     Rate and Rhythm: Normal rate and regular rhythm.  Pulmonary:     Effort: Pulmonary effort is normal.     Breath sounds: Normal breath sounds.  Abdominal:     General: Bowel sounds are normal. There is no distension.     Palpations: Abdomen is soft.     Tenderness: There is abdominal tenderness. There is no right CVA tenderness, left CVA tenderness or guarding.     Comments: Mild generalized abdominal tenderness to palpation without distention or guarding.  States feels more like a fullness  Musculoskeletal:        General: Normal range of motion.     Cervical back: Normal range of motion and neck supple.  Skin:    General: Skin is  warm and dry.  Neurological:     General: No focal deficit present.  Mental Status: He is oriented to person, place, and time.  Psychiatric:        Mood and Affect: Mood normal.        Thought Content: Thought content normal.        Judgment: Judgment normal.      UC Treatments / Results  Labs (all labs ordered are listed, but only abnormal results are displayed) Labs Reviewed  GASTROINTESTINAL PANEL BY PCR, STOOL (REPLACES STOOL CULTURE)  C DIFFICILE QUICK SCREEN W PCR REFLEX    RESP PANEL BY RT-PCR (RSV, FLU A&B, COVID)  [id number]  CBC WITH DIFFERENTIAL/PLATELET  COMPREHENSIVE METABOLIC PANEL    EKG   Radiology No results found.  Procedures Procedures (including critical care time)  Medications Ordered in UC Medications - No data to display  Initial Impression / Assessment and Plan / UC Course  I have reviewed the triage vital signs and the nursing notes.  Pertinent labs & imaging results that were available during my care of the patient were reviewed by me and considered in my medical decision making (see chart for details).     Given duration, worsening course, unclear if resolving from a viral GI illness and now with new illness or just a continuation of the initial illness.  COVID and flu testing, GI panel, C. difficile, CBC, CMP all pending.  We will start Cipro in the meantime in case an infectious diarrhea based on duration and symptoms.  Brat diet, electrolytes.  ED for any worsening symptoms.  Final Clinical Impressions(s) / UC Diagnoses   Final diagnoses:  Fever, unspecified  Diarrhea, unspecified type     Discharge Instructions      Eat bland foods, drink plenty of fluids including electrolyte solutions and go to the emergency department if your symptoms worsen at any time.    ED Prescriptions     Medication Sig Dispense Auth. Provider   ciprofloxacin (CIPRO) 500 MG tablet Take 1 tablet (500 mg total) by mouth 2 (two) times daily. 14 tablet  Volney American, [place]      PDMP not reviewed this encounter.   Volney American, [place] 02/05/22 1904

## 2022-02-06 LAB — CBC WITH DIFFERENTIAL/PLATELET
Basophils Absolute: 0 10*3/uL (ref 0.0–0.2)
Basos: 0 %
EOS (ABSOLUTE): 0.1 10*3/uL (ref 0.0–0.4)
Eos: 1 %
Hematocrit: 43.7 % (ref 37.5–51.0)
Hemoglobin: 14.7 g/dL (ref 13.0–17.7)
Immature Grans (Abs): 0 10*3/uL (ref 0.0–0.1)
Immature Granulocytes: 1 %
Lymphocytes Absolute: 1.4 10*3/uL (ref 0.7–3.1)
Lymphs: 23 %
MCH: 29.2 pg (ref 26.6–33.0)
MCHC: 33.6 g/dL (ref 31.5–35.7)
MCV: 87 fL (ref 79–97)
Monocytes Absolute: 0.7 10*3/uL (ref 0.1–0.9)
Monocytes: 11 %
Neutrophils Absolute: 4.1 10*3/uL (ref 1.4–7.0)
Neutrophils: 64 %
Platelets: 165 10*3/uL (ref 150–450)
RBC: 5.04 x10E6/uL (ref 4.14–5.80)
RDW: 13.2 % (ref 11.6–15.4)
WBC: 6.3 10*3/uL (ref 3.4–10.8)

## 2022-02-06 LAB — COMPREHENSIVE METABOLIC PANEL
ALT: 76 IU/L — ABNORMAL HIGH (ref 0–44)
AST: 54 IU/L — ABNORMAL HIGH (ref 0–40)
Albumin/Globulin Ratio: 1.2 (ref 1.2–2.2)
Albumin: 3.9 g/dL — ABNORMAL LOW (ref 4.1–5.1)
Alkaline Phosphatase: 126 IU/L — ABNORMAL HIGH (ref 44–121)
BUN/Creatinine Ratio: 13 (ref 9–20)
BUN: 9 mg/dL (ref 6–20)
Bilirubin Total: 0.9 mg/dL (ref 0.0–1.2)
CO2: 20 mmol/L (ref 20–29)
Calcium: 8.7 mg/dL (ref 8.7–10.2)
Chloride: 101 mmol/L (ref 96–106)
Creatinine, Ser: 0.71 mg/dL — ABNORMAL LOW (ref 0.76–1.27)
Globulin, Total: 3.3 g/dL (ref 1.5–4.5)
Glucose: 306 mg/dL — ABNORMAL HIGH (ref 70–99)
Potassium: 4.2 mmol/L (ref 3.5–5.2)
Sodium: 136 mmol/L (ref 134–144)
Total Protein: 7.2 g/dL (ref 6.0–8.5)
eGFR: 120 mL/min/{1.73_m2} (ref >59)

## 2022-02-06 LAB — GASTROINTESTINAL PANEL BY PCR, STOOL (REPLACES STOOL CULTURE)
Adenovirus F40/41: NOT DETECTED
Astrovirus: NOT DETECTED
Campylobacter species: DETECTED — AB
Cyclospora cayetanensis: NOT DETECTED
Norovirus GI/GII: NOT DETECTED
Plesimonas shigelloides: NOT DETECTED
Rotavirus A: NOT DETECTED
Salmonella species: NOT DETECTED
Shigella/Enteroinvasive E coli (EIEC): NOT DETECTED
Vibrio cholerae: NOT DETECTED

## 2022-02-08 LAB — GASTROINTESTINAL PANEL BY PCR, STOOL (REPLACES STOOL CULTURE)
Cryptosporidium: NOT DETECTED
Entamoeba histolytica: NOT DETECTED
Enteroaggregative E coli (EAEC): NOT DETECTED
Enteropathogenic E coli (EPEC): NOT DETECTED
Enterotoxigenic E coli (ETEC): NOT DETECTED
Giardia lamblia: NOT DETECTED
Sapovirus (I, II, IV, and V): NOT DETECTED
Shiga like toxin producing E coli (STEC): NOT DETECTED
Vibrio species: NOT DETECTED
Yersinia enterocolitica: NOT DETECTED

## 2022-12-31 ENCOUNTER — Ambulatory Visit (INDEPENDENT_AMBULATORY_CARE_PROVIDER_SITE_OTHER): Payer: BC Managed Care – PPO | Admitting: Urology

## 2022-12-31 VITALS — BP 133/75 | HR 76

## 2022-12-31 DIAGNOSIS — Z3009 Encounter for other general counseling and advice on contraception: Secondary | ICD-10-CM | POA: Diagnosis not present

## 2022-12-31 MED ORDER — DIAZEPAM 10 MG PO TABS: 10.0000 mg | ORAL_TABLET | Freq: Once | ORAL | 0 refills | Status: AC

## 2022-12-31 NOTE — Progress Notes (Unsigned)
12/31/2022 10:32 AM   Alejandro Kennedy 08/23/1980 [id number]  Referring provider: April Manson, NP [phone] [address]  [place] [address]  Desires sterilization   HPI: Alejandro Kennedy is a 41yo here for consideration of vasectomy. He has 3 healthy children. No scrotal surgeries. No issues with erectile dysfunction. No significant LUTS   PMH: Past Medical History:  Diagnosis Date   Generalized headaches    Hepatic steatosis    High cholesterol    Hypertension    Sleep apnea    Ulcer    per pt was told he had an ulcer, no prior EGD    Surgical History: Past Surgical History:  Procedure Laterality Date   COLONOSCOPY N/A 02/25/2016   Procedure: COLONOSCOPY;  Surgeon: Corbin Ade, MD;  Location: [facility];  Service: Endoscopy;  Laterality: N/A;  1:45 PM   cyst removed  2004   from throat   ESOPHAGOGASTRODUODENOSCOPY  06/16/2011   Dr.Fields- mild gastritis, bx= mild chronic inflammation   ESOPHAGOGASTRODUODENOSCOPY  10/20/2015   Dr. Joslyn Hy- normal esophagus, normal duodenum, erythema and heme in the antrum and stomach body compatible with gastritis   HYDROGEN BREATH TEST  07/14/2011   Procedure: HYDROGEN BREATH TEST;  Surgeon: Arlyce Harman, MD;  Location: [facility];  Service: Endoscopy;  Laterality: N/A;  EVALUATE FOR LACTOSE INTOLERANCE     Home Medications:  Allergies as of 12/31/2022       Reactions   Amoxil [amoxicillin]         Medication List        Accurate as of December 31, 2022 10:32 AM. If you have any questions, ask your nurse or doctor.          atorvastatin 20 MG tablet Commonly known as: LIPITOR Take 20 mg by mouth daily.   ciprofloxacin 500 MG tablet Commonly known as: CIPRO Take 1 tablet (500 mg total) by mouth 2 (two) times daily.   dicyclomine 20 MG tablet Commonly known as: BENTYL Take 10 mg by mouth 4 (four) times daily -  before meals and at bedtime.   Fish Oil 1000 MG Caps Take 1 capsule by mouth 2 (two) times  daily.   lidocaine 2 % solution Commonly known as: XYLOCAINE Use as directed 15 mLs in the mouth or throat every 6 (six) hours as needed (worsening reflux).   lisinopril 10 MG tablet Commonly known as: ZESTRIL Take 10 mg by mouth daily.   ondansetron 4 MG tablet Commonly known as: ZOFRAN Take 1 tablet (4 mg total) by mouth every 6 (six) hours.   pantoprazole 40 MG tablet Commonly known as: PROTONIX Take 1 tablet (40 mg total) by mouth daily.   peg 3350 powder 100 g Solr Commonly known as: MOVIPREP Take 1 kit (200 g total) by mouth as directed.   Vitamin D-1000 Max St 25 MCG (1000 UT) tablet Generic drug: Cholecalciferol Take 1,000 Units by mouth daily.        Allergies:  Allergies  Allergen Reactions   Amoxil [Amoxicillin]     Family History: Family History  Problem Relation Age of Onset   Diabetes Mother    Hypertension Mother    Diabetes Father    Heart disease Unknown    Kidney disease Unknown    Arthritis Unknown    Colon cancer Neg Hx     Social History:  reports that he has never smoked. He has never used smokeless tobacco. He reports current alcohol use.  He reports that he does not use drugs.  ROS: All other review of systems were reviewed and are negative except what is noted above in HPI  Physical Exam: BP 133/75   Pulse 76   Constitutional:  Alert and oriented, No acute distress. HEENT: [place] AT, moist mucus membranes.  Trachea midline, no masses. Cardiovascular: No clubbing, cyanosis, or edema. Respiratory: Normal respiratory effort, no increased work of breathing. GI: Abdomen is soft, nontender, nondistended, no abdominal masses GU: No CVA tenderness. Circumcised phallus. No masses/lesions on penis, testis, scrotum. Bilateral vas deferens palpable Lymph: No cervical or inguinal lymphadenopathy. Skin: No rashes, bruises or suspicious lesions. Neurologic: Grossly intact, no focal deficits, moving all 4 extremities. Psychiatric: Normal mood and  affect.  Laboratory Data: Lab Results  Component Value Date   WBC 6.3 02/05/2022   HGB 14.7 02/05/2022   HCT 43.7 02/05/2022   MCV 87 02/05/2022   PLT 165 02/05/2022    Lab Results  Component Value Date   CREATININE 0.71 (L) 02/05/2022    No results found for: "PSA"  No results found for: "TESTOSTERONE"  Lab Results  Component Value Date   HGBA1C 5.9 (H) 01/20/2010    Urinalysis    Component Value Date/Time   COLORURINE YELLOW 09/15/2015 1233   APPEARANCEUR CLEAR 09/15/2015 1233   LABSPEC 1.020 09/15/2015 1233   PHURINE 6.0 09/15/2015 1233   GLUCOSEU NEGATIVE 09/15/2015 1233   HGBUR NEGATIVE 09/15/2015 1233   BILIRUBINUR NEGATIVE 09/15/2015 1233   KETONESUR NEGATIVE 09/15/2015 1233   PROTEINUR NEGATIVE 09/15/2015 1233   UROBILINOGEN 0.2 05/03/2011 1851   NITRITE NEGATIVE 09/15/2015 1233   LEUKOCYTESUR NEGATIVE 09/15/2015 1233    No results found for: "LABMICR", "WBCUA", "RBCUA", "LABEPIT", "MUCUS", "BACTERIA"  Pertinent Imaging:  No results found for this or any previous visit.  No results found for this or any previous visit.  No results found for this or any previous visit.  No results found for this or any previous visit.  No results found for this or any previous visit.  No valid procedures specified. No results found for this or any previous visit.  No results found for this or any previous visit.   Assessment & Plan:    1. Vasectomy evaluation Schedule for vasectomy Rx for valium sent to pharmacy   No follow-ups on file.  Wilkie Aye, MD  [facility] [place]

## 2023-01-04 ENCOUNTER — Encounter: Payer: Self-pay | Admitting: Urology

## 2023-01-04 NOTE — Patient Instructions (Signed)
Vasectomy Vasectomy is a procedure to cut and then tie or burn the ends of the vas deferens. The vas deferens is a tube that carries sperm from the testicle to the urethra. This procedure blocks sperm from being released during sex. This ensures that sperm does not go into the vagina. A vasectomy does not affect your ability to have sex or your desire for sex. Also, it does not prevent sexually transmitted infections, or STIs. Vasectomy is a permanent and effective form of birth control. You should have a vasectomy only when you and your partner are sure you do not want children in the future. Do not get this procedure when you are stressed, such as after divorce or pregnancy loss. Tell a health care provider about: Any allergies you have. All medicines you are taking. These include vitamins, herbs, eye drops, creams, and over-the-counter medicines. Any problems you or family members have had with anesthesia. Any bleeding problems you have. Any surgeries you have had. Any medical conditions you have. What are the risks? Your provider will talk with you about risks. These may include: Infection. Bleeding and swelling of the scrotum. The scrotum is the sac that contains the testicles. Allergies to medicines. Failure of the procedure to prevent pregnancy. There is a very small chance that the tied or burned parts of the vas deferens may reconnect. If this happens, you could still make a person pregnant. Pain in the scrotum that goes on after you heal from the procedure. What happens before the procedure? Medicines Ask your health care provider about: Changing or stopping your regular medicines. These include any diabetes medicines or blood thinners you take. Taking medicines such as aspirin and ibuprofen. These medicines can thin your blood. Do not take them unless your provider tells you to take them. Taking over-the-counter medicines, vitamins, herbs, and supplements. You may be told to take a  sedative a few hours before the procedure. A sedative helps you relax. Surgery safety Ask your provider: How your surgery site will be marked. What steps will be taken to help prevent infection. These steps may include: Removing hair at the surgery site. Washing skin with a soap that kills germs. Taking antibiotics. General instructions Do not use any products that contain nicotine or tobacco for at least 4 weeks before the procedure. These products include cigarettes, chewing tobacco, and vaping devices, such as e-cigarettes. If you need help quitting, ask your provider. If you'll be going home right after the procedure, plan to have a responsible adult: Take you home from the hospital or clinic. You'll not be allowed to drive. Care for you for the time you are told. What happens during the procedure?  You may be given: A sedative. This helps you relax. You may also be told to take this a few hours before the procedure. Anesthesia. This keeps you from feeling pain. It will numb certain areas of your body. Your provider will feel for your vas deferens. To get to the vas deferens, your provider may: Make a very small cut, or incision, in your scrotum. Make a hole by piercing the scrotum. Your vas deferens will be pulled out of your scrotum and cut. To close it, the cut ends of the vas deferens will be tied or burned. The vas deferens will be put back into your scrotum. The cut or the hole in the scrotum will be closed with stitches. The stitches will dissolve and will not need to be removed. The procedure will be done  again on the other side of your scrotum. The procedure may vary among providers and hospitals. What happens after the procedure? You will be monitored to make sure that you do not have problems. You will be asked not to ejaculate for at least 1 week after the procedure, or for as long as you are told. You will need to use another form of birth control for 2-4 months after  the procedure. Do this until your provider confirms that there's no sperm in your semen. You may be given something to wear to support your scrotum. This includes a jockstrap or underwear with a pouch. If you were given a sedative during your procedure, do not drive or use machines until your provider says that it's safe. This information is not intended to replace advice given to you by your health care provider. Make sure you discuss any questions you have with your health care provider. Document Revised: 08/03/2022 Document Reviewed: 08/03/2022 Elsevier Patient Education  2024 [employer].

## 2023-03-07 ENCOUNTER — Ambulatory Visit (INDEPENDENT_AMBULATORY_CARE_PROVIDER_SITE_OTHER): Payer: BC Managed Care – PPO | Admitting: Urology

## 2023-03-07 VITALS — BP 135/79 | HR 83

## 2023-03-07 DIAGNOSIS — Z302 Encounter for sterilization: Secondary | ICD-10-CM

## 2023-03-07 DIAGNOSIS — Z9852 Vasectomy status: Secondary | ICD-10-CM

## 2023-03-07 NOTE — Patient Instructions (Signed)
Vasectomy Postoperative Instructions ? ?Please bring back a semen analysis in approximately 3 months.  ?Your semen analysis  will need to be taken to  ?[facility]  [address] [place], [place] [address] ?([phone] ? ? You will be given a sterile specimen cup. Please label the cup with your name, date of birth, date and time of collections.  ?What to Expect ? - slight redness, swelling and scant drainage along the incision ? - mild to moderate discomfort ? - black and blue (bruising) as the tissue heals ? - low grade fever ? - scrotal sensitivity and/or tenderness ?- Edges of the incision may pull apart and heal slowly, sometimes a knot may be present which remains for several months.  This is NORMAL and all part of the healing process. ?- if stitches are placed, they do not need to be removed ?- if you have pain or discomfort immediately after the vasectomy, you may use OTC pain medication for relief , ex: tylenol.  After local anesthetic wears off an ice pack will provide additional comfort and can also prevent swelling if used ? ?Activity ? - no sexual intercourse for at lease 5 days depending on comfort ? - no heavy lifting for 48-72 hours (anything over 5-10 lbs) ? ?Wound Care ? - shower only after 24 hours ? - no tub baths, hot tub, or pools for at least 7 days ? - ice packs for 48 hours: 30 minutes on and 30 minutes off ? ?Problem to Report ? - generalized redness ? - increased pain and swelling ? - fever greater than 101 F ? - significant drainage or bleeding from the wound ? ?TO DO ?- Ejaculations help to clear the passage of sperm, but you must use another from of birth control until you are told you may discontinue its use!! ?- You will be given a specimen cup to bring back a semen sample in 3 months to check and see if its clear of sperm.  Only after the semen is sent for analysis and is reported back as clear should you use this as your primary form of birth control!    ?

## 2023-03-08 NOTE — Progress Notes (Signed)
03/07/2023  CC: desires sterilization   HPI: Mr Alejandro Kennedy is a 41yo here for vasectomy Blood pressure 135/79, pulse 83. NED. A&Ox3.   No respiratory distress   Abd soft, NT, ND Normal external genitalia with patent urethral meatus  A timeout was performed.  Patient's identity and consent was confirmed.  All questions were answered.   Bilateral Vasectomy Procedure  Pre-Procedure: - Patient's scrotum was prepped and draped for vasectomy. - The vas was palpated through the scrotal skin on the left. - 1% Xylocaine was injected into the skin and surrounding tissue for placement  - In a similar manner, the vas on the right was identified, anesthetized, and stabilized.  Procedure: - A sharp hemostat was used to make a small stab incision in the skin overlying the vas - The left vas was isolated and brought up through the incision exposing that structure. - Bleeding points were cauterized as they occurred. - The vas was free from the surrounding structures and brought to the view. - A segment was positioned for placement with a hemostat. - A second hemostat was placed and a small segment between the two hemostats and was removed for inspection. - Each end of the transected vas lumen was fulgurated/ obliterated using needlepoint electrocautery -A fascial interposition was performed on testicular end of the vas using #3-0 chromic suture -The same procedure was performed on the right. - A single suture of #3-0 chromic catgut was used to close each lateral scrotal skin incision - A dressing was applied.  Post-Procedure: - Patient was instructed in care of the operative area - A specimen is to be delivered in 12 weeks   -Another form of contraception is to be used until post vasectomy semen analysis  Kennedy Aye, MD
